# Patient Record
Sex: Female | Born: 1995 | Race: White | Hispanic: No | Marital: Married | Smoking: Never smoker
Health system: Southern US, Community
[De-identification: ages and names within clinical notes are randomized; demographics above are authoritative.]

## PROBLEM LIST (undated history)

## (undated) DIAGNOSIS — Z8759 Personal history of other complications of pregnancy, childbirth and the puerperium: Secondary | ICD-10-CM

## (undated) DIAGNOSIS — G43909 Migraine, unspecified, not intractable, without status migrainosus: Secondary | ICD-10-CM

## (undated) HISTORY — DX: Personal history of other complications of pregnancy, childbirth and the puerperium: Z87.59

## (undated) HISTORY — DX: Migraine, unspecified, not intractable, without status migrainosus: G43.909

## (undated) HISTORY — PX: WISDOM TOOTH EXTRACTION: SHX21

---

## 2019-06-22 ENCOUNTER — Ambulatory Visit: Payer: Self-pay | Admitting: Internal Medicine

## 2019-06-22 ENCOUNTER — Other Ambulatory Visit: Payer: Self-pay

## 2019-06-22 ENCOUNTER — Encounter: Payer: Self-pay | Admitting: Internal Medicine

## 2019-06-22 VITALS — BP 109/77 | HR 87 | Temp 99.7°F | Resp 14 | Ht 63.0 in | Wt 141.0 lb

## 2019-06-22 DIAGNOSIS — S93491A Sprain of other ligament of right ankle, initial encounter: Secondary | ICD-10-CM

## 2019-06-22 NOTE — Patient Instructions (Signed)
Ankle Sprain  An ankle sprain is a stretch or tear in one of the tough tissues (ligaments) that connect the bones in your ankle. An ankle sprain can happen when the ankle rolls outward (inversion sprain) or inward (eversion sprain). What are the causes? This condition is caused by rolling or twisting the ankle. What increases the risk? You are more likely to develop this condition if you play sports. What are the signs or symptoms? Symptoms of this condition include:  Pain in your ankle.  Swelling.  Bruising. This may happen right after you sprain your ankle or 1-2 days later.  Trouble standing or walking. How is this diagnosed? This condition is diagnosed with:  A physical exam. During the exam, your doctor will press on certain parts of your foot and ankle and try to move them in certain ways.  X-ray imaging. These may be taken to see how bad the sprain is and to check for broken bones. How is this treated? This condition may be treated with:  A brace or splint. This is used to keep the ankle from moving until it heals.  An elastic bandage. This is used to support the ankle.  Crutches.  Pain medicine.  Surgery. This may be needed if the sprain is very bad.  Physical therapy. This may help to improve movement in the ankle. Follow these instructions at home: If you have a brace or a splint:  Wear the brace or splint as told by your doctor. Remove it only as told by your doctor.  Loosen the brace or splint if your toes: ? Tingle. ? Lose feeling (become numb). ? Turn cold and blue.  Keep the brace or splint clean.  If the brace or splint is not waterproof: ? Do not let it get wet. ? Cover it with a watertight covering when you take a bath or a shower. If you have an elastic bandage (dressing):  Remove it to shower or bathe.  Try not to move your ankle much, but wiggle your toes from time to time. This helps to prevent swelling.  Adjust the dressing if it feels  too tight.  Loosen the dressing if your foot: ? Loses feeling. ? Tingles. ? Becomes cold and blue. Managing pain, stiffness, and swelling   Take over-the-counter and prescription medicines only as told by doctor.  For 2-3 days, keep your ankle raised (elevated) above the level of your heart.  If told, put ice on the injured area: ? If you have a removable brace or splint, remove it as told by your doctor. ? Put ice in a plastic bag. ? Place a towel between your skin and the bag. ? Leave the ice on for 20 minutes, 2-3 times a day. General instructions  Rest your ankle.  Do not use your injured leg to support your body weight until your doctor says that you can. Use crutches as told by your doctor.  Do not use any products that contain nicotine or tobacco, such as cigarettes, e-cigarettes, and chewing tobacco. If you need help quitting, ask your doctor.  Keep all follow-up visits as told by your doctor. Contact a doctor if:  Your bruises or swelling are quickly getting worse.  Your pain does not get better after you take medicine. Get help right away if:  You cannot feel your toes or foot.  Your foot or toes look blue.  You have very bad pain that gets worse. Summary  An ankle sprain is a stretch   or tear in one of the tough tissues (ligaments) that connect the bones in your ankle.  This condition is caused by rolling or twisting the ankle.  Symptoms include pain, swelling, bruising, and trouble walking.  To help with pain and swelling, put ice on the injured ankle, raise your ankle above the level of your heart, and use an elastic bandage. Also, rest as told by your doctor.  Keep all follow-up visits as told by your doctor. This is important. This information is not intended to replace advice given to you by your health care provider. Make sure you discuss any questions you have with your health care provider. Document Released: 05/11/2008 Document Revised: 04/19/2018  Document Reviewed: 04/19/2018 Elsevier Patient Education  2020 [employer] Inc[employer]  

## 2019-06-22 NOTE — Progress Notes (Signed)
S - presents with R ankle pain  Started this am, twisted coming down steps and painful driving, went to work and sent here as painful walking.   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 Butalbital-APAP-Caff-Cod 50-300-40-30 MG CAPS Take by mouth.    . levonorgestrel-ethinyl estradiol (ALESSE) 0.1-20 MG-MCG tablet Take by mouth.     No current facility-administered medications on file prior to visit.      No Known Allergies    No tob use  O - NAD, masked  BP 109/77 (BP Location: Left Arm, Patient Position: Sitting, Cuff Size: Normal)   Pulse 87   Temp 99.7 F (37.6 C) (Oral)   Resp 14   Ht 5\' 3"  (1.6 m)   Wt 141 lb (64 kg)   SpO2 97%   BMI 24.98 kg/m    R Ankle - Limited ROM at extremes due to pain, no increase in pain with resistance testing Tender greatest at ATF, NT inferior to lat malleolus, Able to bear weight only on her right foot in the office without pain, increased pain with trying to ambulate No swelling surrounding lateral malleolus presently No bruising  NT distal fibula NT sydesmotic region NT medial malleolus NT 5th MT Squeeze test neg Ant drawer test neg Increased pain with plantar flexion and inversion Neurovasc intact in foot, nails painted  Affect not flat, approp with conversation  Ass - Lat ankle sprain (inversion) - Mild/Grade I  Plan -  Educated on diagnosis X-ray discussed - Not felt indicated acutely with fracture unlikely  Relative rest  until symptoms improving Crutches not felt needed as did well with brace in place in office  RICE modalities reviewed with liberal use of ice at 20 minute intervals short term, elevate  Compression with tie -up brace and can use for support as improving and returning to more activities  Can use ibuprofen products, OTC strength 3-4 times daily with food, not use routinely, only prn Note given for work with limitations noted until Mon 7/20 and expect her to be better by then and not limited with normal activities and ok to return to work then without restrictions. If still havnig symptoms that are limiting her activities, instructed to F/u Monday.

## 2019-10-08 DIAGNOSIS — G43019 Migraine without aura, intractable, without status migrainosus: Secondary | ICD-10-CM | POA: Insufficient documentation

## 2019-10-17 ENCOUNTER — Other Ambulatory Visit: Payer: Self-pay

## 2019-10-17 ENCOUNTER — Ambulatory Visit: Payer: Self-pay

## 2019-10-17 DIAGNOSIS — R0989 Other specified symptoms and signs involving the circulatory and respiratory systems: Secondary | ICD-10-CM

## 2019-10-17 NOTE — Progress Notes (Signed)
Presents with C/O Nasal congestion & Runny nose.  Denies contact with anyone known to be or suspected to be positive for covid.  She was asking if she should have a covid test.  Spoke with Randel Pigg, PA-C (Interim Provider) in the clinic today.  Recommended she have a covid screen to R/O covid before allowing her into the clinic.  Explained to [facility] that the Provider recommended she have the covid screen to rule it out, before allowing her into the clinic.  Verbalized understanding.  States she has Mychart & if she goes to [facility], the results will be uploaded into it with 24-48 hours after the test.  Informed her they offer drive up testing.  If can't get it done today, said she will go in the morning.  Requested she update [place] on her results.  AMD

## 2019-10-18 ENCOUNTER — Other Ambulatory Visit: Payer: Self-pay

## 2019-10-18 DIAGNOSIS — Z20822 Contact with and (suspected) exposure to covid-19: Secondary | ICD-10-CM

## 2019-10-21 LAB — NOVEL CORONAVIRUS, NAA: SARS-CoV-2, NAA: DETECTED — AB

## 2020-06-11 ENCOUNTER — Ambulatory Visit (INDEPENDENT_AMBULATORY_CARE_PROVIDER_SITE_OTHER): Payer: BC Managed Care – PPO

## 2020-06-11 ENCOUNTER — Other Ambulatory Visit: Payer: Self-pay

## 2020-06-11 ENCOUNTER — Ambulatory Visit
Admission: EM | Admit: 2020-06-11 | Discharge: 2020-06-11 | Disposition: A | Discharge status: Home / Self Care | Payer: BC Managed Care – PPO | Attending: Emergency Medicine | Admitting: Emergency Medicine

## 2020-06-11 ENCOUNTER — Encounter: Payer: Self-pay | Admitting: Emergency Medicine

## 2020-06-11 DIAGNOSIS — S838X2A Sprain of other specified parts of left knee, initial encounter: Secondary | ICD-10-CM

## 2020-06-11 DIAGNOSIS — S86812A Strain of other muscle(s) and tendon(s) at lower leg level, left leg, initial encounter: Secondary | ICD-10-CM | POA: Diagnosis not present

## 2020-06-11 MED ORDER — IBUPROFEN 600 MG PO TABS
600.0000 mg | ORAL_TABLET | Freq: Four times a day (QID) | ORAL | 0 refills | Status: DC | PRN
Start: 2020-06-11 — End: 2020-12-26

## 2020-06-11 NOTE — ED Triage Notes (Signed)
Patient in today c/o left lower leg pain x today. Patient states she was at the gym and was doing dead lifts with a heavy weight and heard something pop in her upper shin.

## 2020-06-11 NOTE — ED Provider Notes (Signed)
HPI  SUBJECTIVE:  Kathryn Wong is a 24 y.o. female who presents with left knee pain inferior to her patella at the tibial tubercle occurring immediately prior to arrival.  States that she was doing dead lifts with heavy weights, felt a pop in the area of pain.  It is sharp, intermittent, present with certain movements.  No swelling, erythema, distal calf swelling.  No knee pain.  No direct trauma to the area of pain.  She states that her knee feels stable.  No recent fluoroquinolone use.  She has not tried anything for this.  Symptoms are better with sitting still, worse with knee flexion and weightbearing.  Past medical history negative for tendon rupture, diabetes, hypertension.  LMP: 2 weeks ago.  Denies possibility being pregnant.  PMD: [facility].  Past Medical History:  Diagnosis Date  . Migraine     Past Surgical History:  Procedure Laterality Date  . WISDOM TOOTH EXTRACTION      Family History  Problem Relation Age of Onset  . Healthy Mother   . Healthy Father     Social History   Tobacco Use  . Smoking status: Never Smoker  . Smokeless tobacco: Never Used  Vaping Use  . Vaping Use: Never used  Substance Use Topics  . Alcohol use: Yes    Comment: socially  . Drug use: Never    No current facility-administered medications for this encounter.  Current Outpatient Medications:  .  AJOVY 225 MG/1.5ML SOAJ, Inject 1 Syringe into the skin every 28 (twenty-eight) days., Disp: , Rfl:  .  Butalbital-APAP-Caff-Cod 50-300-40-30 MG CAPS, Take by mouth., Disp: , Rfl:  .  levonorgestrel-ethinyl estradiol (ALESSE) 0.1-20 MG-MCG tablet, Take by mouth., Disp: , Rfl:  .  rizatriptan (MAXALT) 10 MG tablet, Take by mouth., Disp: , Rfl:  .  ibuprofen (ADVIL) 600 MG tablet, Take 1 tablet (600 mg total) by mouth every 6 (six) hours as needed., Disp: 30 tablet, Rfl: 0  No Known Allergies   ROS  As noted in HPI.   Physical Exam  BP (!) 123/91 (BP Location: Left  Arm)   Pulse 80   Temp 98.1 F (36.7 C) (Oral)   Resp 18   Ht 5\' 3"  (1.6 m)   Wt 66.2 kg   LMP 05/28/2020 (Approximate)   SpO2 100%   BMI 25.86 kg/m   Constitutional: Well developed, well nourished, no acute distress Eyes:  EOMI, conjunctiva normal bilaterally HENT: Normocephalic, atraumatic,mucus membranes moist Respiratory: Normal inspiratory effort Cardiovascular: Normal rate GI: nondistended skin: No rash, skin intact Musculoskeletal: Patella nontender.  Patellar tendon nontender.  Positive tenderness at the tibial tubercle.  No soft tissue defect.  Mild knee crepitus with flexion/extension, patient states that this is not new.  No erythema, bruising. left Kneecap symmetric with the right leg.  No tenderness superior to the patella.  No tenderness over the entire knee.  Patient able to flex/extend at knee.  No tenderness along the distal tibia, fibula, ankle.  DP 2+.  Sensation grossly intact distally. Neurologic: Alert & oriented x 3, no focal neuro deficits Psychiatric: Speech and behavior appropriate   ED Course   Medications - No data to display  Orders Placed This Encounter  Procedures  . DG Tibia/Fibula Left    Standing Status:   Standing    Number of Occurrences:   1    Order Specific Question:   Reason for Exam (SYMPTOM  OR DIAGNOSIS REQUIRED)    Answer:   tenderness  at tibial tubercle.  Rule out fracture.  Marland Kitchen Apply ace wrap    Left leg under knee    Standing Status:   Standing    Number of Occurrences:   1  . Crutches    Standing Status:   Standing    Number of Occurrences:   1    No results found for this or any previous visit (from the past 24 hour(s)). DG Tibia/Fibula Left  Result Date: 06/11/2020 CLINICAL DATA:  Tenderness at tibial tubercle. EXAM: LEFT TIBIA AND FIBULA - 2 VIEW COMPARISON:  No prior. FINDINGS: No acute bony or joint abnormality identified. No evidence of fracture or dislocation. Soft tissues are unremarkable. IMPRESSION: No acute  abnormality. Electronically Signed   By: Maisie Fus  Register   On: 06/11/2020 12:18    ED Clinical Impression  1. Patellar tendon strain, left, initial encounter      ED Assessment/Plan  Suspect avulsion fracture of the tibial tubercle.  Getting tib-fib, will reassess.  Plan to put in Ace wrap, crutches, Tylenol/ibuprofen, rest, ice..   [place] Narcotic database reviewed for this patient, and feel that the risk/benefit ratio today is favorable for proceeding with a prescription for controlled substance.  Reviewed imaging independently.  No fracture or dislocation.  See radiology report for full details.  Presentation consistent with a left patellar tendon strain/sprain.  Follow-up with [facility] orth[facility]ics in a week if not better.  Discussed  imaging, MDM, treatment plan, and plan for follow-up with patient. patient agrees with plan.   Meds ordered this encounter  Medications  . ibuprofen (ADVIL) 600 MG tablet    Sig: Take 1 tablet (600 mg total) by mouth every 6 (six) hours as needed.    Dispense:  30 tablet    Refill:  0    *This clinic note was created using Scientist, clinical (histocompatibility and immunogenetics). Therefore, there may be occasional mistakes despite careful proofreading.   ?    Domenick Gong, MD 06/11/20 1725

## 2020-06-11 NOTE — Discharge Instructions (Addendum)
Your x-ray was negative for fracture.  I suspect that you have partially torn your patellar tendon or at least strained it.  Ace wrap, crutches, ice your knee for 15 minutes at a time.  Take ibuprofen 600 mg combined with the 1000 mg of Tylenol 3-4 times a day as needed for pain.  Follow-up with [facility] if not better in a week.  You may need an MRI to fully assess the extent of the injury.

## 2020-12-22 ENCOUNTER — Emergency Department
Admission: EM | Admit: 2020-12-22 | Discharge: 2020-12-23 | Disposition: A | Discharge status: Home / Self Care | Payer: BC Managed Care – PPO | Attending: Emergency Medicine | Admitting: Emergency Medicine

## 2020-12-22 ENCOUNTER — Other Ambulatory Visit: Payer: Self-pay

## 2020-12-22 ENCOUNTER — Emergency Department: Payer: BC Managed Care – PPO

## 2020-12-22 DIAGNOSIS — Z3A01 Less than 8 weeks gestation of pregnancy: Secondary | ICD-10-CM | POA: Insufficient documentation

## 2020-12-22 DIAGNOSIS — O00102 Left tubal pregnancy without intrauterine pregnancy: Secondary | ICD-10-CM | POA: Diagnosis not present

## 2020-12-22 DIAGNOSIS — O4690 Antepartum hemorrhage, unspecified, unspecified trimester: Secondary | ICD-10-CM

## 2020-12-22 DIAGNOSIS — O26851 Spotting complicating pregnancy, first trimester: Secondary | ICD-10-CM | POA: Diagnosis present

## 2020-12-22 DIAGNOSIS — O009 Unspecified ectopic pregnancy without intrauterine pregnancy: Secondary | ICD-10-CM

## 2020-12-22 LAB — ABO/RH: ABO/RH(D): O POS

## 2020-12-22 LAB — HEPATIC FUNCTION PANEL
ALT: 30 U/L (ref 0–44)
AST: 29 U/L (ref 15–41)
Albumin: 4.4 g/dL (ref 3.5–5.0)
Alkaline Phosphatase: 63 U/L (ref 38–126)
Bilirubin, Direct: 0.1 mg/dL (ref 0.0–0.2)
Indirect Bilirubin: 1 mg/dL — ABNORMAL HIGH (ref 0.3–0.9)
Total Bilirubin: 1.1 mg/dL (ref 0.3–1.2)
Total Protein: 7.3 g/dL (ref 6.5–8.1)

## 2020-12-22 LAB — BASIC METABOLIC PANEL
Anion gap: 12 (ref 5–15)
BUN: 13 mg/dL (ref 6–20)
CO2: 25 mmol/L (ref 22–32)
Calcium: 9.1 mg/dL (ref 8.9–10.3)
Chloride: 102 mmol/L (ref 98–111)
Creatinine, Ser: 0.72 mg/dL (ref 0.44–1.00)
GFR, Estimated: >60 mL/min (ref >60)
Glucose, Bld: 96 mg/dL (ref 70–99)
Potassium: 3.9 mmol/L (ref 3.5–5.1)
Sodium: 139 mmol/L (ref 135–145)

## 2020-12-22 LAB — CBC
HCT: 39.2 % (ref 36.0–46.0)
Hemoglobin: 13.1 g/dL (ref 12.0–15.0)
MCH: 29.9 pg (ref 26.0–34.0)
MCHC: 33.4 g/dL (ref 30.0–36.0)
MCV: 89.5 fL (ref 80.0–100.0)
Platelets: 312 10*3/uL (ref 150–400)
RBC: 4.38 MIL/uL (ref 3.87–5.11)
RDW: 12.1 % (ref 11.5–15.5)
WBC: 10.3 10*3/uL (ref 4.0–10.5)
nRBC: 0 % (ref 0.0–0.2)

## 2020-12-22 LAB — HCG, QUANTITATIVE, PREGNANCY: hCG, Beta Chain, Quant, S: 3088 m[IU]/mL — ABNORMAL HIGH (ref <5)

## 2020-12-22 LAB — POC URINE PREG, ED: Preg Test, Ur: POSITIVE — AB

## 2020-12-22 MED ORDER — METHOTREXATE FOR ECTOPIC PREGNANCY
50.0000 mg/m2 | Freq: Once | INTRAMUSCULAR | Status: AC
  Administered 2020-12-22: 90 mg via INTRAMUSCULAR
  Filled 2020-12-22: qty 1

## 2020-12-22 NOTE — ED Triage Notes (Signed)
Reports pelvic cramping over last few days, light red spotting this AM and then heavier bleeding that started at 3 PM today. Pt approx [redacted] weeks pregnant. Has not bled through 1 pad yet.

## 2020-12-22 NOTE — Consult Note (Signed)
Consult History and Physical   SERVICE: Obstetrics/Gynecology  Patient Name: Kathryn Wong Patient MRN:   [id number]  CC: left side pelvic pain and light red vaginal bleeding.   HPI: Kathryn Wong is a 25 y.o. G1P0 with left side pelvic pain and vaginal bleeding in early pregnancy.     Review of Systems: positives in bold GEN:   fevers, chills, weight changes, appetite changes, fatigue, night sweats HEENT:  HA, vision changes, hearing loss, congestion, rhinorrhea, sinus pressure, dysphagia CV:   CP, palpitations PULM:  SOB, cough GI:  abd pain, N/V/D/C GU:  dysuria, urgency, frequency, left pelvic pain and light red vaginal bleeding.  MSK:  arthralgias, myalgias, back pain, swelling SKIN:  rashes, color changes, pallor NEURO:  numbness, weakness, tingling, seizures, dizziness, tremors PSYCH:  depression, anxiety, behavioral problems, confusion  HEME/LYMPH:  easy bruising or bleeding ENDO:  heat/cold intolerance  Past Obstetrical History: OB History    Gravida  1   Para      Term      Preterm      AB      Living        SAB      IAB      Ectopic      Multiple      Live Births              Past Gynecologic History: Patient's last menstrual period was 10/22/2020 (approximate). Menstrual frequency Q4wks lasting 5days for light flow, requiring 2-3 pads/day  Past Medical History: Past Medical History:  Diagnosis Date  . Migraine     Past Surgical History:   Past Surgical History:  Procedure Laterality Date  . WISDOM TOOTH EXTRACTION      Family History:  family history includes Healthy in her father and mother.  Social History:  Social History   Socioeconomic History  . Marital status: Single    Spouse name: Not on file  . Number of children: Not on file  . Years of education: Not on file  . Highest education level: Not on file  Occupational History  . Not on file  Tobacco Use  . Smoking status: Never Smoker  . Smokeless tobacco:  Never Used  Vaping Use  . Vaping Use: Never used  Substance and Sexual Activity  . Alcohol use: Yes    Comment: socially  . Drug use: Never  . Sexual activity: Not on file  Other Topics Concern  . Not on file  Social History Narrative  . Not on file   Social Determinants of Health   Financial Resource Strain: Not on file  Food Insecurity: Not on file  Transportation Needs: Not on file  Physical Activity: Not on file  Stress: Not on file  Social Connections: Not on file  Intimate Partner Violence: Not on file    Home Medications:  Medications reconciled in EPIC  No current facility-administered medications on file prior to encounter.   Current Outpatient Medications on File Prior to Encounter  Medication Sig Dispense Refill  . AJOVY 225 MG/1.5ML SOAJ Inject 1 Syringe into the skin every 28 (twenty-eight) days.    . Butalbital-APAP-Caff-Cod 50-300-40-30 MG CAPS Take by mouth.    Marland Kitchen ibuprofen (ADVIL) 600 MG tablet Take 1 tablet (600 mg total) by mouth every 6 (six) hours as needed. 30 tablet 0  . levonorgestrel-ethinyl estradiol (ALESSE) 0.1-20 MG-MCG tablet Take by mouth.    . rizatriptan (MAXALT) 10 MG tablet Take by mouth.      Allergies:  No Known Allergies  Physical Exam:  Temp:  [98 F (36.7 C)] 98 F (36.7 C) (01/16 1958) Pulse Rate:  [87] 87 (01/16 1958) Resp:  [16] 16 (01/16 1958) BP: (154)/(81) 154/81 (01/16 1958) SpO2:  [98 %] 98 % (01/16 1958) Weight:  [69.9 kg-70.2 kg] 70.2 kg (01/16 2100)   General Appearance:  Well developed, well nourished, no acute distress, alert and oriented x3 HEENT:  Normocephalic atraumatic, extraocular movements intact, moist mucous membranes Cardiovascular:  regular rate and rhythm, no murmurs Pulmonary:  Normal respiratory effort Abdomen:  soft, nontender, nondistended, no abnormal masses, no epigastric pain Extremities:  Full range of motion, no pedal edema Skin:  normal coloration and turgor, no rashes Psychiatric:   Normal mood and affect, appropriately upset and concerned, no AH/VH Pelvic:  deferred   Labs/Studies:   CBC and Coags:  Lab Results  Component Value Date   WBC 10.3 12/22/2020   HGB 13.1 12/22/2020   HCT 39.2 12/22/2020   MCV 89.5 12/22/2020   PLT 312 12/22/2020   CMP:  Lab Results  Component Value Date   NA 139 12/22/2020   K 3.9 12/22/2020   CL 102 12/22/2020   CO2 25 12/22/2020   BUN 13 12/22/2020   CREATININE 0.72 12/22/2020   Other Labs: Pending CMP- LFTs needed  TVUS:   Other Imaging: US OB LESS THAN 14 WEEKS WITH OB TRANSVAGINAL  Result Date: 12/22/2020 CLINICAL DATA:  Vaginal bleeding EXAM: OBSTETRIC <14 WK [place] AND TRANSVAGINAL OB US TECHNIQUE: Both transabdominal and transvaginal ultrasound examinations were performed for complete evaluation of the gestation as well as the maternal uterus, adnexal regions, and pelvic cul-de-sac. Transvaginal technique was performed to assess early pregnancy. COMPARISON:  None. FINDINGS: Intrauterine gestational sac: None Yolk sac:  Not Visualized. Embryo:  Visualized within the left adnexa. Cardiac Activity: Not Visualized. Heart Rate: N/A  bpm CRL:  6.4 mm   6 w   3 d                  [place] EDC: N/A Subchorionic hemorrhage:  Moderate Maternal uterus/adnexae: The right ovary measures 2.9 cm x 1.6 cm x 2.3 cm and is normal in appearance. The left ovary measures 2.7 cm x 2.0 cm x 2.5 cm and is normal in appearance. A 1.5 cm x 1.7 cm left adnexal gestational sac is seen, adjacent to the left ovary. This contains a fetal pole which measures 6.4 mm. No fetal cardiac activity is identified. IMPRESSION: 1. Findings consistent with a nonviable left adnexal ectopic pregnancy, at approximately 6 weeks and 3 days gestation by ultrasound evaluation. Electronically Signed   By: Aram Candela M.D.   On: 12/22/2020 21:12     Assessment / Plan:   Kathryn Wong is a 25 y.o. G1P0 who presents with left pelvic pain and vaginal bleeding.    Reviewed  the results from TVUS and lab studies with Kathryn Wong and answered all of her questions. Her HCG is 3088, Hgb 13.1, and TVUS findings c/w left adnexal gestational sac with fetal pole c/w ectopic pregnancy.  Discussed lab results and imaging with Dr Dalbert Garnet, attending Ob physician, Kathryn Wong is a candidate for MTX therapy.   Kathryn Wong's overall appearance and physical exam are benign and present no need for acute mgmt at this time. Based on her lab values and US findings we are unable to determine the exact location of her pregnancy. I counseled the Kathryn Wong about how an ectopic pregnancy can result in life-threatening complications. Reviewed  with the Kathryn Wong about the importance of serial HCG examinations, the first on Thursday (day 4 after MTX injection)   2. Rh status: Kathryn Wong's MBT is O Pos. She is not a candidate for 50 mcg of Rho(D) IgG.    Thank you for the opportunity to be involved with this Kathryn Wong's care.   Randa Ngo, CNM 12/22/2020 10:15 PM

## 2020-12-22 NOTE — Discharge Instructions (Signed)
Ectopic Pregnancy  An ectopic pregnancy happens when a fertilized egg attaches (implants) outside the uterus. In a normal pregnancy, a fertilized egg implants in the uterus. An ectopic pregnancy cannot develop into a healthy baby. Most ectopic pregnancies occur in one of the fallopian tubes, which is where an egg travels from an ovary to get to the uterus. This is called a tubal pregnancy. An ectopic pregnancy can also happen on an ovary, on the cervix, or in the abdomen. When a fertilized egg implants on tissue outside the uterus and begins to grow, it may cause the tissue to tear or burst. This is known as a ruptured ectopic pregnancy. The tear or burst causes internal bleeding. This may cause intense pain in the abdomen. An ectopic pregnancy is a medical emergency and can be life-threatening. What are the causes? The most common cause of this condition is damage to one of the fallopian tubes. A fallopian tube may be narrowed or blocked, and that stops the fertilized egg from reaching the uterus. Sometimes, the cause of this condition is not known. What increases the risk? The following factors may make you more likely to develop this condition:  Having gone through infertility treatment before.  Having had an ectopic pregnancy before.  Having had surgery to have the fallopian tubes tied.  Becoming pregnant while using an intrauterine device for birth control.  Taking birth control pills before the age of 16. Other risk factors include:  Smoking.  Alcohol use.  History of DES exposure. DES is a medicine that was used until 1971 and affected babies whose mothers took the medicine. What are the signs or symptoms? Common symptoms of this condition include:  Missing a menstrual period.  Nausea or tiredness.  Tender breasts.  Other normal pregnancy symptoms. Other symptoms may include:  Pain during sex.  Vaginal bleeding or spotting.  Cramping or pain in the lower abdomen.  A  fast heartbeat, low blood pressure, and sweating.  Pain or increased pressure while having a bowel movement. Symptoms of a ruptured ectopic pregnancy and internal bleeding may include:  Sudden, severe pain in the abdomen.  Dizziness, weakness, feeling light-headed, or fainting.  Pain in the shoulder or neck area. How is this diagnosed? This condition is diagnosed by:  A blood test to check for the pregnancy hormone.  A pelvic exam to find painful areas or a mass in the abdomen.  Ultrasound. A probe is inserted into the vagina to see if there is a pregnancy in or outside the uterus.  Taking a sample of tissue from the uterus.  Surgery to look closely at the fallopian tubes through an incision in the abdomen. How is this treated? This condition is usually treated with medicine or surgery. Sometimes, ectopic pregnancies can resolve on their own, under close monitoring by your health care provider. Medicine A medicine called methotrexate may be given to cause the pregnancy tissue to be absorbed. The medicine may be given if:  The diagnosis is made early, with no signs of active bleeding.  The fallopian tube has not torn or burst. You will need blood tests to make sure the medicine is working. It may take 4-6 weeks for the pregnancy tissues to be absorbed. Surgery Surgery may be performed to:  Remove the pregnancy tissue.  Stop internal bleeding.  Remove part or all of the fallopian tube.  Remove the uterus. This is rare. After surgery, you may need to have blood tests to make sure the surgery worked.   Follow these instructions at home: Medicines  Take over-the-counter and prescription medicines only as told by your health care provider.  Ask your health care provider if the medicine prescribed to you: ? Requires you to avoid driving or using machinery. ? Can cause constipation. You may need to take these actions to prevent or treat constipation:  Drink enough fluid to  keep your urine pale yellow.  Take over-the-counter or prescription medicines.  Eat foods that are high in fiber, such as beans, whole grains, and fresh fruits and vegetables.  Limit foods that are high in fat and processed sugars, such as fried or sweet foods. General instructions  Rest or limit your activity, if told by your health care provider.  Do not have sex or put anything in your vagina, such as tampons or douches, for 6 weeks or until your health care provider says it is safe.  Do not lift anything that is heavier than 10 lb (4.5 kg), or the limit that you are told, until your health care provider says that it is safe.  Return to your normal activities as told by your health care provider. Ask your health care provider what activities are safe for you.  Keep all follow-up visits. This is important. Contact a health care provider if:  You have a fever or chills.  You have nausea and vomiting. Get help right away if:  Your pain gets worse or is not relieved by medicine.  You feel dizzy or weak.  You feel light-headed or you faint.  You have sudden, severe pain in your abdomen.  You have sudden pain in the shoulder or neck area. Summary  An ectopic pregnancy happens when a fertilized egg implants outside the uterus. Most ectopic pregnancies occur in one of the fallopian tubes.  An ectopic pregnancy is a medical emergency and can be life-threatening.  The most common cause of this condition is damage to one of the fallopian tubes.  This condition is usually treated with medicine or surgery. Some ectopic pregnancies resolve on their own, under close monitoring by your health care provider. This information is not intended to replace advice given to you by your health care provider. Make sure you discuss any questions you have with your health care provider. Document Revised: 03/05/2020 Document Reviewed: 03/05/2020 Elsevier Patient Education  2021 Elsevier  Inc.    Methotrexate Treatment for an Ectopic Pregnancy Methotrexate is a medicine that treats an ectopic pregnancy. In this type of pregnancy, the fertilized egg attaches (implants) outside the uterus. An ectopic pregnancy cannot develop into a healthy baby. Methotrexate works by stopping the growth of the fertilized egg. It also helps the body absorb tissue from the egg. This takes about 2-6 weeks. An ectopic pregnancy can be life-threatening. However, most ectopic pregnancies can be successfully treated with methotrexate if they are diagnosed early. Tell a health care provider about:  Any allergies you have.  All medicines you are taking, including vitamins, herbs, eye drops, creams, and over-the-counter medicines.  Any medical conditions you have. What are the risks? Generally, this is a safe treatment. However, problems may occur, including:  Digestive problems. You may have: ? Nausea. ? Vomiting. ? Diarrhea. ? Cramping in your abdomen.  Bleeding or spotting from your vagina.  Feeling dizzy or light-headed.  Mouth sores.  Inflammation of the lining of your lungs (pneumonitis).  Damage to nearby structures or organs, such as damage to the liver.  Hair loss. There is a risk that methotrexate treatment   will fail and the pregnancy will continue. There is also a risk that the ectopic pregnancy might tear or burst (rupture) during use of this medicine. What happens before the procedure?  Blood tests will be done to check how your disease-fighting system (immune system), liver, and kidneys are working.  You will also have blood tests to measure your pregnancy hormone levels and to find out your blood type.  You will be given a shot of a medicine called Rho(D) immune globulin if: ? You are Rh-negative and the father is Rh-positive. ? You are Rh-negative and the father's Rh type is unknown. What happens during the procedure?  Methotrexate will be injected into your  muscle. ? Methotrexate may be given as a single dose of medicine or a series of doses over time, depending on your response to the treatment. ? Methotrexate injections are given by a health care provider. Injection is the most common way that this medicine is used to treat an ectopic pregnancy.  You may also receive other medicines to manage your ectopic pregnancy. The procedure may vary among health care providers and hospitals. What can I expect after treatment? After your treatment, it is common to have:  Cramping in your abdomen.  Bleeding in your vagina.  Tiredness (fatigue).  Nausea.  Vomiting.  Diarrhea. Blood tests will be done at timed intervals for several days or weeks to check your pregnancy hormone levels. The blood tests will be done until the pregnancy hormone can no longer be found in the blood. If the methotrexate treatment does not work, a surgical procedure may be done to remove the ectopic pregnancy. Follow these instructions at home: Medicines  Take over-the-counter and prescription medicines only as told by your health care provider.  Do not take prescription pain medicines, aspirin, ibuprofen, naproxen, or any other NSAIDs.  Do not take folic acid, prenatal vitamins, or other vitamins that contain folic acid. Activity  Do not have sex, douche, or put anything, such as tampons, in your vagina until your health care provider says it is okay.  Limit activities that take a lot of effort as told by your health care provider. General instructions  Do not drink alcohol.  Follow instructions from your health care provider about eating restrictions, such as avoiding foods that produce a lot of gas. These foods can hide the signs of a ruptured ectopic pregnancy.  Limit exposure to sunlight or artificial UV light such as from tanning beds. Methotrexate can make you more sensitive to the sun.  Follow instructions from your health care provider on how and when to  report any symptoms that may indicate a ruptured ectopic pregnancy.  Keep all follow-up visits. This is important.   Contact a health care provider if:  You have persistent nausea and vomiting.  You have persistent diarrhea.  You are having a reaction to the medicine. This may include: ? Unusual fatigue. ? Skin rash. Get help right away if:  Pain in your abdomen or in the area between your hip bones (pelvic area) gets worse.  You have more bleeding from your vagina.  You feel light-headed or you faint.  You are short of breath.  Your heart rate increases.  You develop a cough.  You have chills or a fever. Summary  Methotrexate is a medicine that treats an ectopic pregnancy. This type of pregnancy forms outside the uterus.  There is a risk that methotrexate treatment will fail and the pregnancy will continue. There is also a   risk that the ectopic pregnancy might tear or burst during use of this medicine.  This medicine may be given in a single dose or a series of doses over time.  After your treatment, blood tests will be done at timed intervals for several days or weeks to check your pregnancy hormone levels. The blood tests will be done until no more pregnancy hormone is found in the blood. This information is not intended to replace advice given to you by your health care provider. Make sure you discuss any questions you have with your health care provider. Document Revised: 05/08/2020 Document Reviewed: 05/08/2020 Elsevier Patient Education  2021 Elsevier Inc.   

## 2020-12-22 NOTE — ED Provider Notes (Signed)
[facility] Emergency Department Provider Note   ____________________________________________   Event Date/Time   First MD Initiated Contact with Patient 12/22/20 2033     (approximate)  I have reviewed the triage vital signs and the nursing notes.   HISTORY  Chief Complaint Vaginal Bleeding    HPI Dinesha Twiggs is a 25 y.o. female G1, P0  Patient reports that she had her first positive pregnancy test on New Year's.  She has never been pregnant before and the last couple of days she has noticed some crampy pain in her left lower abdomen, she also had some light spotting and then heavier red vaginal bleeding this evening.  She called and spoke with midwife at [facility] clinic who recommended she come to the ER for evaluation  She has blood through about 1 pad  Reports mild discomfort in the left lower abdomen.  No fevers or chills.  No chest pain or trouble breathing.  No nausea had some vomiting earlier in the pregnancy but none recent   Past Medical History:  Diagnosis Date  . Migraine     Patient Active Problem List   Diagnosis Date Noted  . Intractable migraine without aura and without status migrainosus 10/08/2019    Past Surgical History:  Procedure Laterality Date  . WISDOM TOOTH EXTRACTION      Prior to Admission medications   Medication Sig Start Date End Date Taking? Authorizing Provider  AJOVY 225 MG/1.5ML SOAJ Inject 1 Syringe into the skin every 28 (twenty-eight) days. 05/18/20   [provider]  Butalbital-APAP-Caff-Cod 50-300-40-30 MG CAPS Take by mouth.    [provider]  ibuprofen (ADVIL) 600 MG tablet Take 1 tablet (600 mg total) by mouth every 6 (six) hours as needed. 06/11/20   Domenick Gong, MD  levonorgestrel-ethinyl estradiol (ALESSE) 0.1-20 MG-MCG tablet Take by mouth. 05/10/19   [provider]  rizatriptan (MAXALT) 10 MG tablet Take by mouth. 12/23/19   [provider]     Allergies Patient has no known allergies.  Family History  Problem Relation Age of Onset  . Healthy Mother   . Healthy Father     Social History Social History   Tobacco Use  . Smoking status: Never Smoker  . Smokeless tobacco: Never Used  Vaping Use  . Vaping Use: Never used  Substance Use Topics  . Alcohol use: Yes    Comment: socially  . Drug use: Never    Review of Systems Constitutional: No fever/chills ENT: No sore throat. Cardiovascular: Denies chest pain. Respiratory: Denies shortness of breath. Gastrointestinal: See HPI Genitourinary: See HPI Musculoskeletal: Negative for back pain. Skin: Negative for rash. Neurological: Negative for headaches, areas of focal weakness or numbness.    ____________________________________________   PHYSICAL EXAM:  VITAL SIGNS: ED Triage Vitals  Enc Vitals Group     BP 12/22/20 1958 (!) 154/81     Pulse Rate 12/22/20 1958 87     Resp 12/22/20 1958 16     Temp 12/22/20 1958 98 F (36.7 C)     Temp Source 12/22/20 1958 Oral     SpO2 12/22/20 1958 98 %     Weight 12/22/20 1959 154 lb (69.9 kg)     Height 12/22/20 1959 5\' 3"  (1.6 m)     Head Circumference --      Peak Flow --      Pain Score 12/22/20 1959 5     Pain Loc --      Pain Edu? --  Excl. in GC? --     Constitutional: Alert and oriented. Well appearing and in no acute distress.  Very pleasant. Eyes: Conjunctivae are normal. Head: Atraumatic. Nose: No congestion/rhinnorhea. Mouth/Throat: Mucous membranes are moist. Neck: No stridor.  Cardiovascular: Normal rate, regular rhythm. Grossly normal heart sounds.  Good peripheral circulation. Respiratory: Normal respiratory effort.  No retractions. Lungs CTAB. Gastrointestinal: Soft and nontender except reports mild tenderness in the left lower quadrant without rebound or guarding. No distention. Musculoskeletal: No lower extremity tenderness nor edema. Neurologic:  Normal speech and language. No  gross focal neurologic deficits are appreciated.  Skin:  Skin is warm, dry and intact. No rash noted. Psychiatric: Mood and affect are normal. Speech and behavior are normal.  ____________________________________________   LABS (all labs ordered are listed, but only abnormal results are displayed)  Labs Reviewed  HCG, QUANTITATIVE, PREGNANCY - Abnormal; Notable for the following components:      Result Value   hCG, Beta Chain, Quant, S 3,088 (*)    All other components within normal limits  HEPATIC FUNCTION PANEL - Abnormal; Notable for the following components:   Indirect Bilirubin 1.0 (*)    All other components within normal limits  POC URINE PREG, ED - Abnormal; Notable for the following components:   Preg Test, Ur POSITIVE (*)    All other components within normal limits  CBC  BASIC METABOLIC PANEL  ABO/RH   ____________________________________________  EKG   ____________________________________________  RADIOLOGY  US OB LESS THAN 14 WEEKS WITH OB TRANSVAGINAL  Result Date: 12/22/2020 CLINICAL DATA:  Vaginal bleeding EXAM: OBSTETRIC <14 WK [place] AND TRANSVAGINAL OB US TECHNIQUE: Both transabdominal and transvaginal ultrasound examinations were performed for complete evaluation of the gestation as well as the maternal uterus, adnexal regions, and pelvic cul-de-sac. Transvaginal technique was performed to assess early pregnancy. COMPARISON:  None. FINDINGS: Intrauterine gestational sac: None Yolk sac:  Not Visualized. Embryo:  Visualized within the left adnexa. Cardiac Activity: Not Visualized. Heart Rate: N/A  bpm CRL:  6.4 mm   6 w   3 d                  [place] EDC: N/A Subchorionic hemorrhage:  Moderate Maternal uterus/adnexae: The right ovary measures 2.9 cm x 1.6 cm x 2.3 cm and is normal in appearance. The left ovary measures 2.7 cm x 2.0 cm x 2.5 cm and is normal in appearance. A 1.5 cm x 1.7 cm left adnexal gestational sac is seen, adjacent to the left ovary. This contains a  fetal pole which measures 6.4 mm. No fetal cardiac activity is identified. IMPRESSION: 1. Findings consistent with a nonviable left adnexal ectopic pregnancy, at approximately 6 weeks and 3 days gestation by ultrasound evaluation. Electronically Signed   By: Aram Candela M.D.   On: 12/22/2020 21:12    Discussed with Dr. Londell Moh personally   Findings consistent with a nonviable left adnexal ectopic pregnancy   ____________________________________________   PROCEDURES  Procedure(s) performed: None  Procedures  Critical Care performed: No  ____________________________________________   INITIAL IMPRESSION / ASSESSMENT AND PLAN / ED COURSE  Pertinent labs & imaging results that were available during my care of the patient were reviewed by me and considered in my medical decision making (see chart for details).   Patient is for evaluation of crampy discomfort left lower quadrant.  Patient recently pregnant.  Differential diagnosis includes gynecologic etiology such as ectopic, threatened miscarriage, first trimester bleeding, subchorionic hemorrhage etc.  She does not  have systemic symptoms or fever.  Reassuring clinical and abdominal examination without peritonitis.  Hemodynamics are normal except for some slight hypertension and she is fully awake and alert nontoxic in appearance.  Able to ambulate without difficulty describes some bleeding but not excessive.  Consult placed with OB/GYN for ectopic.  Seen and evaluated by Gevena Barre  Case discussed with Dr. Dalbert Garnet, they are going to start her on methotrexate and anticipate discharge thereafter with a follow-up setup in 4 days as well.  ----------------------------------------- 10:54 PM on 12/22/2020 -----------------------------------------  Patient to receive methotrexate as ordered by OB/GYN, and then after brief observation plan is to discharge with careful return precautions and follow-up as scheduled in 4 days with  OB/GYN.  Return precautions and treatment recommendations and follow-up discussed with the patient who is agreeable with the plan.       ____________________________________________   FINAL CLINICAL IMPRESSION(S) / ED DIAGNOSES  Final diagnoses:  Ectopic pregnancy, unspecified location, unspecified whether intrauterine pregnancy present        Note:  This document was prepared using Dragon voice recognition software and may include unintentional dictation errors       Sharyn Creamer, MD 12/22/20 2333

## 2020-12-22 NOTE — ED Notes (Signed)
OB RN at bedside to administer methotexate.

## 2020-12-23 ENCOUNTER — Other Ambulatory Visit: Payer: Self-pay

## 2020-12-23 ENCOUNTER — Emergency Department
Admission: EM | Admit: 2020-12-23 | Discharge: 2020-12-23 | Disposition: A | Discharge status: Home / Self Care | Payer: BC Managed Care – PPO | Source: Home / Self Care | Attending: Emergency Medicine | Admitting: Emergency Medicine

## 2020-12-23 ENCOUNTER — Emergency Department: Payer: BC Managed Care – PPO

## 2020-12-23 DIAGNOSIS — Z3A01 Less than 8 weeks gestation of pregnancy: Secondary | ICD-10-CM | POA: Insufficient documentation

## 2020-12-23 DIAGNOSIS — O00102 Left tubal pregnancy without intrauterine pregnancy: Secondary | ICD-10-CM

## 2020-12-23 LAB — CBC
HCT: 38.9 % (ref 36.0–46.0)
Hemoglobin: 13.3 g/dL (ref 12.0–15.0)
MCH: 30.4 pg (ref 26.0–34.0)
MCHC: 34.2 g/dL (ref 30.0–36.0)
MCV: 88.8 fL (ref 80.0–100.0)
Platelets: 317 10*3/uL (ref 150–400)
RBC: 4.38 MIL/uL (ref 3.87–5.11)
RDW: 12.4 % (ref 11.5–15.5)
WBC: 8.9 10*3/uL (ref 4.0–10.5)
nRBC: 0 % (ref 0.0–0.2)

## 2020-12-23 LAB — COMPREHENSIVE METABOLIC PANEL
ALT: 28 U/L (ref 0–44)
AST: 26 U/L (ref 15–41)
Albumin: 4.4 g/dL (ref 3.5–5.0)
Alkaline Phosphatase: 65 U/L (ref 38–126)
Anion gap: 10 (ref 5–15)
BUN: 13 mg/dL (ref 6–20)
CO2: 25 mmol/L (ref 22–32)
Calcium: 9.3 mg/dL (ref 8.9–10.3)
Chloride: 104 mmol/L (ref 98–111)
Creatinine, Ser: 0.76 mg/dL (ref 0.44–1.00)
GFR, Estimated: >60 mL/min (ref >60)
Glucose, Bld: 96 mg/dL (ref 70–99)
Potassium: 4 mmol/L (ref 3.5–5.1)
Sodium: 139 mmol/L (ref 135–145)
Total Bilirubin: 1.6 mg/dL — ABNORMAL HIGH (ref 0.3–1.2)
Total Protein: 7.2 g/dL (ref 6.5–8.1)

## 2020-12-23 LAB — HCG, QUANTITATIVE, PREGNANCY: hCG, Beta Chain, Quant, S: 3506 m[IU]/mL — ABNORMAL HIGH (ref <5)

## 2020-12-23 MED ORDER — OXYCODONE HCL 5 MG PO TABS: 5.0000 mg | ORAL_TABLET | Freq: Four times a day (QID) | ORAL | 0 refills | Status: DC | PRN

## 2020-12-23 NOTE — ED Triage Notes (Signed)
PT to ED via POV. PT was dx with ectopic L side pregnancy last night, given methotrexate and d/c home. PT back today d/t continued pain and new intermittent sharp pain.

## 2020-12-23 NOTE — ED Notes (Signed)
Discharge papers given to patient and reviewed by EDP Funke at this time. Pt visualized in NAD while in the lobby.

## 2020-12-23 NOTE — ED Notes (Signed)
Pt discharged by dr Fuller Plan

## 2020-12-23 NOTE — ED Provider Notes (Signed)
[facility] Emergency Department Provider Note  ____________________________________________   Event Date/Time   First MD Initiated Contact with Patient 12/23/20 1737     (approximate)  I have reviewed the triage vital signs and the nursing notes.   HISTORY  Chief Complaint Ectopic Pregnancy    HPI Kathryn Wong is a 25 y.o. female G1 who comes in with concerns for abdominal pain.  Patient was seen yesterday and was diagnosed with an ectopic pregnancy.  She was treated with methotrexate.  She returns to the ER today because she states that the pain had gotten worse.  The pain is constantly there but then she has intermittently had sharp pains that are moderate, constant, nothing makes it better including Tylenol, nothing makes it worse.  Denies any chest pain, shortness of breath.  Her vaginal bleeding has slowed down.  She called the midwife who told her to come to the ER to be evaluated.          Past Medical History:  Diagnosis Date  . Migraine     Patient Active Problem List   Diagnosis Date Noted  . Intractable migraine without aura and without status migrainosus 10/08/2019    Past Surgical History:  Procedure Laterality Date  . WISDOM TOOTH EXTRACTION      Prior to Admission medications   Medication Sig Start Date End Date Taking? Authorizing Provider  AJOVY 225 MG/1.5ML SOAJ Inject 1 Syringe into the skin every 28 (twenty-eight) days. 05/18/20   [provider]  Butalbital-APAP-Caff-Cod 50-300-40-30 MG CAPS Take by mouth.    [provider]  ibuprofen (ADVIL) 600 MG tablet Take 1 tablet (600 mg total) by mouth every 6 (six) hours as needed. 06/11/20   Domenick Gong, MD  levonorgestrel-ethinyl estradiol (ALESSE) 0.1-20 MG-MCG tablet Take by mouth. 05/10/19   [provider]  rizatriptan (MAXALT) 10 MG tablet Take by mouth. 12/23/19   [provider]    Allergies Patient has no known  allergies.  Family History  Problem Relation Age of Onset  . Healthy Mother   . Healthy Father     Social History Social History   Tobacco Use  . Smoking status: Never Smoker  . Smokeless tobacco: Never Used  Vaping Use  . Vaping Use: Never used  Substance Use Topics  . Alcohol use: Yes    Comment: socially  . Drug use: Never      Review of Systems Constitutional: No fever/chills Eyes: No visual changes. ENT: No sore throat. Cardiovascular: Denies chest pain. Respiratory: Denies shortness of breath. Gastrointestinal: Abdominal pain no nausea, no vomiting.  No diarrhea.  No constipation. Genitourinary: Negative for dysuria.  Vaginal bleeding resolving Musculoskeletal: Negative for back pain. Skin: Negative for rash. Neurological: Negative for headaches, focal weakness or numbness. All other ROS negative ____________________________________________   PHYSICAL EXAM:  VITAL SIGNS: ED Triage Vitals  Enc Vitals Group     BP 12/23/20 1436 124/72     Pulse Rate 12/23/20 1436 75     Resp 12/23/20 1436 18     Temp 12/23/20 1436 98 F (36.7 C)     Temp Source 12/23/20 1436 Oral     SpO2 12/23/20 1436 97 %     Weight 12/23/20 1437 154 lb 12.2 oz (70.2 kg)     Height 12/23/20 1437 5\' 3"  (1.6 m)     Head Circumference --      Peak Flow --      Pain Score 12/23/20 1437 7  Pain Loc --      Pain Edu? --      Excl. in GC? --     Constitutional: Alert and oriented. Well appearing and in no acute distress. Eyes: Conjunctivae are normal. EOMI. Head: Atraumatic. Nose: No congestion/rhinnorhea. Mouth/Throat: Mucous membranes are moist.   Neck: No stridor. Trachea Midline. FROM Cardiovascular: Normal rate, regular rhythm. Grossly normal heart sounds.  Good peripheral circulation. Respiratory: Normal respiratory effort.  No retractions. Lungs CTAB. Gastrointestinal: Soft and nontender. No distention. No abdominal bruits.  Musculoskeletal: No lower extremity tenderness  nor edema.  No joint effusions. Neurologic:  Normal speech and language. No gross focal neurologic deficits are appreciated.  Skin:  Skin is warm, dry and intact. No rash noted. Psychiatric: Mood and affect are normal. Speech and behavior are normal. GU: Deferred   ____________________________________________   LABS (all labs ordered are listed, but only abnormal results are displayed)  Labs Reviewed  HCG, QUANTITATIVE, PREGNANCY - Abnormal; Notable for the following components:      Result Value   hCG, Beta Chain, Quant, S 3,506 (*)    All other components within normal limits  COMPREHENSIVE METABOLIC PANEL - Abnormal; Notable for the following components:   Total Bilirubin 1.6 (*)    All other components within normal limits  CBC   ____________________________________________   RADIOLOGY   Official radiology report(s): US OB LESS THAN 14 WEEKS WITH OB TRANSVAGINAL  Result Date: 12/23/2020 CLINICAL DATA:  Known ectopic gestation with pelvic pain. Patient given methotrexate 1 day prior EXAM: OBSTETRIC <14 WK [place] AND TRANSVAGINAL OB US TECHNIQUE: Both transabdominal and transvaginal ultrasound examinations were performed for complete evaluation of the gestation as well as the maternal uterus, adnexal regions, and pelvic cul-de-sac. Transvaginal technique was performed to assess early pregnancy. COMPARISON:  None. FINDINGS: Intrauterine gestational sac: Not visualized Yolk sac: Not visualized Embryo: Not visualized Cardiac Activity: Not visualized Subchorionic hemorrhage:  None visualized. Maternal uterus/adnexae: The known ectopic gestation in the left adnexa measures 5 mm in length, compared to 6 mm in length 1 day prior. This known ectopic gestation does not show fetal heart activity currently. Is less well-defined than on 1 day prior study. The ectopic gestational sac within the left adnexa is unchanged. Right ovary appears normal. Moderate free fluid is noted in the cul-de-sac region,  similar to 1 day prior. IMPRESSION: Known ectopic gestation again noted on the left, less well-defined than 1 day prior. No fetal heart activity identified. Suspect change in appearance from 1 day prior is due to the methotrexate administration. Gestational sac in this area is stable. Moderate fluid in the cul-de-sac appear stable compared to 1 day prior. No intrauterine gestation identified. Uterus appears normal. Right ovary appears normal. Electronically Signed   By: Bretta Bang III M.D.   On: 12/23/2020 15:52   US OB LESS THAN 14 WEEKS WITH OB TRANSVAGINAL  Result Date: 12/22/2020 CLINICAL DATA:  Vaginal bleeding EXAM: OBSTETRIC <14 WK [place] AND TRANSVAGINAL OB US TECHNIQUE: Both transabdominal and transvaginal ultrasound examinations were performed for complete evaluation of the gestation as well as the maternal uterus, adnexal regions, and pelvic cul-de-sac. Transvaginal technique was performed to assess early pregnancy. COMPARISON:  None. FINDINGS: Intrauterine gestational sac: None Yolk sac:  Not Visualized. Embryo:  Visualized within the left adnexa. Cardiac Activity: Not Visualized. Heart Rate: N/A  bpm CRL:  6.4 mm   6 w   3 d  [place] EDC: N/A Subchorionic hemorrhage:  Moderate Maternal uterus/adnexae: The right ovary measures 2.9 cm x 1.6 cm x 2.3 cm and is normal in appearance. The left ovary measures 2.7 cm x 2.0 cm x 2.5 cm and is normal in appearance. A 1.5 cm x 1.7 cm left adnexal gestational sac is seen, adjacent to the left ovary. This contains a fetal pole which measures 6.4 mm. No fetal cardiac activity is identified. IMPRESSION: 1. Findings consistent with a nonviable left adnexal ectopic pregnancy, at approximately 6 weeks and 3 days gestation by ultrasound evaluation. Electronically Signed   By: Aram Candela M.D.   On: 12/22/2020 21:12    ____________________________________________   PROCEDURES  Procedure(s) performed (including Critical  Care):  Procedures   ____________________________________________   INITIAL IMPRESSION / ASSESSMENT AND PLAN / ED COURSE  Kathryn Wong was evaluated in Emergency Department on 12/23/2020 for the symptoms described in the history of present illness. She was evaluated in the context of the global COVID-19 pandemic, which necessitated consideration that the patient might be at risk for infection with the SARS-CoV-2 virus that causes COVID-19. Institutional protocols and algorithms that pertain to the evaluation of patients at risk for COVID-19 are in a state of rapid change based on information released by regulatory bodies including the CDC and federal and state organizations. These policies and algorithms were followed during the patient's care in the ED.     Patient is a well-appearing 25 year old who comes in with concern for worsening pain in the setting of her known ectopic pregnancy.  Will get repeat hCG to make sure that is downtrending.  Will get labs to evaluate for anemia.  Will get repeat ultrasound to make sure that there is not worse hemoperitoneum.  At this time I have low suspicion for other acute pathology such as appendicitis, SBO.  Patient was seen from triage due to no rooms being available  hCG is slightly uptrending to 3500 from 3000.  Transvaginal ultrasound shows ectopic pregnancy without a heartbeat.  Stable amount of free fluid.  Discussed with the [facility] clinic OB team to let them know about patient being here.  However I suspect patient will be able to be discharged home given she is hemodynamically stable with stable hemoglobin.  Discussed with Dr. Dalbert Garnet.  She feels comfortable patient going home at this time given how well-appearing she is appears in reassuring work-up.  We will give a few doses of oxycodone to help with pain.  Patient will be discharged home at this time and felt comfortable with this plan.  She has follow-up on Thursday.  She feels comfortable  going home      ____________________________________________   FINAL CLINICAL IMPRESSION(S) / ED DIAGNOSES   Final diagnoses:  Left tubal pregnancy, unspecified whether intrauterine pregnancy present      MEDICATIONS GIVEN DURING THIS VISIT:  Medications - No data to display   ED Discharge Orders         Ordered    oxyCODONE (ROXICODONE) 5 MG immediate release tablet  Every 6 hours PRN        12/23/20 1752           Note:  This document was prepared using Dragon voice recognition software and may include unintentional dictation errors.   Concha Se, MD 12/23/20 [phone]

## 2020-12-23 NOTE — Discharge Instructions (Addendum)
TECHNIQUE:  Both transabdominal and transvaginal ultrasound examinations were  performed for complete evaluation of the gestation as well as the  maternal uterus, adnexal regions, and pelvic cul-de-sac.  Transvaginal technique was performed to assess early pregnancy.   COMPARISON:  None.   FINDINGS:  Intrauterine gestational sac: Not visualized   Yolk sac: Not visualized   Embryo: Not visualized   Cardiac Activity: Not visualized   Subchorionic hemorrhage:  None visualized.   Everything looks stable at this time. You may have additional pain. You should take the oxycodone to help with it. They will follow-up with you in the clinic. Return to the ER if you feel lightheaded, fatigue or any other concerns   Maternal uterus/adnexae: The known ectopic gestation in the left  adnexa measures 5 mm in length, compared to 6 mm in length 1 day  prior. This known ectopic gestation does not show fetal heart  activity currently. Is less well-defined than on 1 day prior study.  The ectopic gestational sac within the left adnexa is unchanged.   Right ovary appears normal. Moderate free fluid is noted in the  cul-de-sac region, similar to 1 day prior.   IMPRESSION:  Known ectopic gestation again noted on the left, less well-defined  than 1 day prior. No fetal heart activity identified. Suspect change  in appearance from 1 day prior is due to the methotrexate  administration. Gestational sac in this area is stable.   Moderate fluid in the cul-de-sac appear stable compared to 1 day  prior.   No intrauterine gestation identified. Uterus appears normal. Right  ovary appears normal.

## 2020-12-23 NOTE — Progress Notes (Signed)
RN administered methotrexate at 2350 on 12/22/20. RN sat with pt for 20 minutes and no adverse reaction transpired. Pt stable. ED RN notified of patients readiness for discharge.

## 2020-12-24 ENCOUNTER — Other Ambulatory Visit: Payer: Self-pay | Admitting: Obstetrics & Gynecology

## 2020-12-24 ENCOUNTER — Observation Stay
Admission: AD | Admit: 2020-12-24 | Discharge: 2020-12-26 | Disposition: A | Discharge status: Home / Self Care | Payer: BC Managed Care – PPO | Source: Ambulatory Visit | Attending: Obstetrics & Gynecology | Admitting: Obstetrics & Gynecology

## 2020-12-24 ENCOUNTER — Encounter: Payer: Self-pay | Admitting: Obstetrics & Gynecology

## 2020-12-24 DIAGNOSIS — O009 Unspecified ectopic pregnancy without intrauterine pregnancy: Secondary | ICD-10-CM

## 2020-12-24 DIAGNOSIS — Z3A01 Less than 8 weeks gestation of pregnancy: Secondary | ICD-10-CM | POA: Insufficient documentation

## 2020-12-24 DIAGNOSIS — Z20822 Contact with and (suspected) exposure to covid-19: Secondary | ICD-10-CM | POA: Diagnosis not present

## 2020-12-24 DIAGNOSIS — O00101 Right tubal pregnancy without intrauterine pregnancy: Secondary | ICD-10-CM

## 2020-12-24 DIAGNOSIS — O00102 Left tubal pregnancy without intrauterine pregnancy: Secondary | ICD-10-CM

## 2020-12-24 HISTORY — DX: Unspecified ectopic pregnancy without intrauterine pregnancy: O00.90

## 2020-12-24 MED ORDER — ONDANSETRON HCL 4 MG PO TABS
4.0000 mg | ORAL_TABLET | Freq: Four times a day (QID) | ORAL | Status: DC | PRN
  Administered 2020-12-24: 4 mg via ORAL
  Filled 2020-12-24: qty 1

## 2020-12-24 MED ORDER — ACETAMINOPHEN 325 MG PO TABS
650.0000 mg | ORAL_TABLET | ORAL | Status: DC | PRN
  Administered 2020-12-24 – 2020-12-26 (×8): 650 mg via ORAL
  Filled 2020-12-24 (×9): qty 2

## 2020-12-24 MED ORDER — ONDANSETRON HCL 4 MG/2ML IJ SOLN: 4.0000 mg | Freq: Four times a day (QID) | INTRAMUSCULAR | Status: DC | PRN

## 2020-12-24 NOTE — H&P (Signed)
Consult History and Physical   SERVICE: Obstetrics and Gynecology   Patient Name: CHARITIE HINOTE Patient MRN:   [id number]  CC: ectopic pregnancy, with pain  HPI: FORESTINE MACHO is a 25 y.o. G1P0 with recently diagnosed left ectopic pregnancy and methotrexate administration on 12/23/20.  She has experienced some intermittent debilitating pain which resolves. Ultrasound in office today showed an intact gestational sac adjacent to the left ovary, with a 2.5cm surrounding area consistent with placenta vs clot. CRL measurses [redacted]w[redacted]d.  There was minimal free fluid, and no signs of decompensation.   She lives from here, and given her persistent put intermittent pain I advised admission for observation until methotrexate could begin to be effective (4 days post admin).  She agreed.   Rh pos  Review of Systems: positives in bold GEN:   fevers, chills, weight changes, appetite changes, fatigue, night sweats HEENT:  HA, vision changes, hearing loss, congestion, rhinorrhea, sinus pressure, dysphagia CV:   CP, palpitations PULM:  SOB, cough GI:  abd pain, N/V/D/C GU:  dysuria, urgency, frequency MSK:  arthralgias, myalgias, back pain, swelling SKIN:  rashes, color changes, pallor NEURO:  numbness, weakness, tingling, seizures, dizziness, tremors PSYCH:  depression, anxiety, behavioral problems, confusion  HEME/LYMPH:  easy bruising or bleeding ENDO:  heat/cold intolerance  Past Obstetrical History: OB History    Gravida  1   Para      Term      Preterm      AB      Living        SAB      IAB      Ectopic      Multiple      Live Births              Past Gynecologic History: Patient's last menstrual period was 10/22/2020 (approximate).   Past Medical History: Past Medical History:  Diagnosis Date  . Migraine     Past Surgical History:   Past Surgical History:  Procedure Laterality Date  . WISDOM TOOTH EXTRACTION      Family History:  family history  includes Healthy in her father and mother.  Social History:  Social History   Socioeconomic History  . Marital status: Single    Spouse name: Not on file  . Number of children: Not on file  . Years of education: Not on file  . Highest education level: Not on file  Occupational History  . Not on file  Tobacco Use  . Smoking status: Never Smoker  . Smokeless tobacco: Never Used  Vaping Use  . Vaping Use: Never used  Substance and Sexual Activity  . Alcohol use: Yes    Comment: socially  . Drug use: Never  . Sexual activity: Not on file  Other Topics Concern  . Not on file  Social History Narrative  . Not on file   Social Determinants of Health   Financial Resource Strain: Not on file  Food Insecurity: Not on file  Transportation Needs: Not on file  Physical Activity: Not on file  Stress: Not on file  Social Connections: Not on file  Intimate Partner Violence: Not on file    Home Medications:  Medications reconciled in EPIC  No current facility-administered medications on file prior to encounter.   Current Outpatient Medications on File Prior to Encounter  Medication Sig Dispense Refill  . AJOVY 225 MG/1.5ML SOAJ Inject 1 Syringe into the skin every 28 (twenty-eight) days.    . Butalbital-APAP-Caff-Cod 50-300-40-30  MG CAPS Take by mouth.    Marland Kitchen ibuprofen (ADVIL) 600 MG tablet Take 1 tablet (600 mg total) by mouth every 6 (six) hours as needed. 30 tablet 0  . levonorgestrel-ethinyl estradiol (ALESSE) 0.1-20 MG-MCG tablet Take by mouth.    . oxyCODONE (ROXICODONE) 5 MG immediate release tablet Take 1 tablet (5 mg total) by mouth every 6 (six) hours as needed for up to 3 days. 12 tablet 0  . rizatriptan (MAXALT) 10 MG tablet Take by mouth.      Allergies:  No Known Allergies  Physical Exam:  Temp:  [98.3 F (36.8 C)-98.5 F (36.9 C)] 98.3 F (36.8 C) (01/18 1500) Pulse Rate:  [67-76] 71 (01/18 1500) Resp:  [18-20] 18 (01/18 1500) BP: (110-115)/(65-78) 110/75  (01/18 1500) SpO2:  [100 %] 100 % (01/18 1500) Weight:  [69.9 kg] 69.9 kg (01/18 1330)   General Appearance:  Well developed, well nourished, no acute distress, alert and oriented, cooperative and appears stated age HEENT:  Normocephalic atraumatic, extraocular movements intact, moist mucous membranes, neck supple with midline trachea and thyroid without masses Cardiovascular:  Normal S1/S2, regular rate and rhythm, no murmurs, 2+ distal pulses Pulmonary:  clear to auscultation, no wheezes, rales or rhonchi, symmetric air entry, good air exchange Abdomen:  Bowel sounds present, soft, nontender, nondistended, no abnormal masses or organomegaly, no epigastric pain.  Tender in LLQ Back: inspection of back is normal Extremities:  extremities normal, no tenderness, atraumatic, no cyanosis or edema Skin:  normal coloration and turgor, no rashes, no suspicious skin lesions noted  Neurologic:  Cranial nerves 2-12 grossly intact, grossly equal strength and muscle tone, normal speech, no focal findings or movement disorder noted. Psychiatric:  Normal mood and affect, appropriate, no AH/VH Pelvic:  Deferred due to ectopic   Labs/Studies:   see results Beta 1/17: 3500 Hb: 13.3 Plt: 317  Imaging: US OB LESS THAN 14 WEEKS WITH OB TRANSVAGINAL  Result Date: 12/23/2020 CLINICAL DATA:  Known ectopic gestation with pelvic pain. Patient given methotrexate 1 day prior EXAM: OBSTETRIC <14 WK [place] AND TRANSVAGINAL OB US TECHNIQUE: Both transabdominal and transvaginal ultrasound examinations were performed for complete evaluation of the gestation as well as the maternal uterus, adnexal regions, and pelvic cul-de-sac. Transvaginal technique was performed to assess early pregnancy. COMPARISON:  None. FINDINGS: Intrauterine gestational sac: Not visualized Yolk sac: Not visualized Embryo: Not visualized Cardiac Activity: Not visualized Subchorionic hemorrhage:  None visualized. Maternal uterus/adnexae: The known  ectopic gestation in the left adnexa measures 5 mm in length, compared to 6 mm in length 1 day prior. This known ectopic gestation does not show fetal heart activity currently. Is less well-defined than on 1 day prior study. The ectopic gestational sac within the left adnexa is unchanged. Right ovary appears normal. Moderate free fluid is noted in the cul-de-sac region, similar to 1 day prior. IMPRESSION: Known ectopic gestation again noted on the left, less well-defined than 1 day prior. No fetal heart activity identified. Suspect change in appearance from 1 day prior is due to the methotrexate administration. Gestational sac in this area is stable. Moderate fluid in the cul-de-sac appear stable compared to 1 day prior. No intrauterine gestation identified. Uterus appears normal. Right ovary appears normal. Electronically Signed   By: Bretta Bang III M.D.   On: 12/23/2020 15:52   US OB LESS THAN 14 WEEKS WITH OB TRANSVAGINAL  Result Date: 12/22/2020 CLINICAL DATA:  Vaginal bleeding EXAM: OBSTETRIC <14 WK [place] AND TRANSVAGINAL OB US TECHNIQUE: Both  transabdominal and transvaginal ultrasound examinations were performed for complete evaluation of the gestation as well as the maternal uterus, adnexal regions, and pelvic cul-de-sac. Transvaginal technique was performed to assess early pregnancy. COMPARISON:  None. FINDINGS: Intrauterine gestational sac: None Yolk sac:  Not Visualized. Embryo:  Visualized within the left adnexa. Cardiac Activity: Not Visualized. Heart Rate: N/A  bpm CRL:  6.4 mm   6 w   3 d                  [place] EDC: N/A Subchorionic hemorrhage:  Moderate Maternal uterus/adnexae: The right ovary measures 2.9 cm x 1.6 cm x 2.3 cm and is normal in appearance. The left ovary measures 2.7 cm x 2.0 cm x 2.5 cm and is normal in appearance. A 1.5 cm x 1.7 cm left adnexal gestational sac is seen, adjacent to the left ovary. This contains a fetal pole which measures 6.4 mm. No fetal cardiac activity is  identified. IMPRESSION: 1. Findings consistent with a nonviable left adnexal ectopic pregnancy, at approximately 6 weeks and 3 days gestation by ultrasound evaluation. Electronically Signed   By: Aram Candela M.D.   On: 12/22/2020 21:12     Assessment / Plan:   MAZIYAH VESSEL is a 25 y.o. G1P0 who presents with left ectopic pregnancy  1. Observation - minimal use of pain control in order to identify any worsening of her pain symptoms.   2. May have light fare, but if pain worsens make NPO.  Recheck beta hcg Thursday ----- Ranae Plumber, MD Attending Obstetrician and Gynecologist [facility], Department of OB/GYN [facility]

## 2020-12-25 DIAGNOSIS — O009 Unspecified ectopic pregnancy without intrauterine pregnancy: Secondary | ICD-10-CM | POA: Diagnosis not present

## 2020-12-25 LAB — URINALYSIS, COMPLETE (UACMP) WITH MICROSCOPIC
Bacteria, UA: NONE SEEN
Bilirubin Urine: NEGATIVE
Glucose, UA: NEGATIVE mg/dL
Ketones, ur: NEGATIVE mg/dL
Leukocytes,Ua: NEGATIVE
Nitrite: NEGATIVE
Protein, ur: NEGATIVE mg/dL
Specific Gravity, Urine: 1.024 (ref 1.005–1.030)
pH: 6 (ref 5.0–8.0)

## 2020-12-25 LAB — SARS CORONAVIRUS 2 (TAT 6-24 HRS): SARS Coronavirus 2: NEGATIVE

## 2020-12-25 NOTE — Progress Notes (Signed)
Obstetric and Gynecology  Subjective  Kathryn Wong is a 26 y.o. female G1P0 who presented on 12/24/2020 for ectopic pregnancy.    She has experienced some intermittent debilitating pain which resolves. Ultrasound in office today showed an intact gestational sac adjacent to the left ovary, with a 2.5cm surrounding area consistent with placenta vs clot. CRL measurses [redacted]w[redacted]d.  There was minimal free fluid, and no signs of decompensation.   She lives from the hospital and agreed for inpatient monitoring of symptoms.   Objective   Vitals:   12/25/20 0336 12/25/20 0753  BP: (!) 97/44 110/65  Pulse: 66 78  Resp:  20  Temp: 98.4 F (36.9 C) 98 F (36.7 C)  SpO2: 100% 99%     Intake/Output Summary (Last 24 hours) at 12/25/2020 1311 Last data filed at 12/25/2020 1130 Gross per 24 hour  Intake --  Output 1050 ml  Net -1050 ml    General: NAD Cardiovascular: RRR, no murmurs Pulmonary: CTAB Abdomen: Tender in LLQ, +BS, no guarding. Extremities: No erythema or cords, no calf tenderness, +warmth with normal peripheral pulses.  Labs: Results for orders placed or performed during the hospital encounter of 12/24/20 (from the past 24 hour(s))  SARS CORONAVIRUS 2 (TAT 6-24 HRS) Nasopharyngeal Nasopharyngeal Swab     Status: None   Collection Time: 12/24/20  1:31 PM   Specimen: Nasopharyngeal Swab  Result Value Ref Range   SARS Coronavirus 2 NEGATIVE NEGATIVE  Urinalysis, Complete w Microscopic Urine, Clean Catch     Status: Abnormal   Collection Time: 12/25/20 11:45 AM  Result Value Ref Range   Color, Urine YELLOW (A) YELLOW   APPearance HAZY (A) CLEAR   Specific Gravity, Urine 1.024 1.005 - 1.030   pH 6.0 5.0 - 8.0   Glucose, UA NEGATIVE NEGATIVE mg/dL   Hgb urine dipstick MODERATE (A) NEGATIVE   Bilirubin Urine NEGATIVE NEGATIVE   Ketones, ur NEGATIVE NEGATIVE mg/dL   Protein, ur NEGATIVE NEGATIVE mg/dL   Nitrite NEGATIVE NEGATIVE   Leukocytes,Ua NEGATIVE NEGATIVE   RBC  / HPF 0-5 0 - 5 RBC/hpf   WBC, UA 0-5 0 - 5 WBC/hpf   Bacteria, UA NONE SEEN NONE SEEN   Squamous Epithelial / LPF 0-5 0 - 5   Mucus PRESENT     Cultures: Results for orders placed or performed during the hospital encounter of 12/24/20  SARS CORONAVIRUS 2 (TAT 6-24 HRS) Nasopharyngeal Nasopharyngeal Swab     Status: None   Collection Time: 12/24/20  1:31 PM   Specimen: Nasopharyngeal Swab  Result Value Ref Range Status   SARS Coronavirus 2 NEGATIVE NEGATIVE Final    Comment: (NOTE) SARS-CoV-2 target nucleic acids are NOT DETECTED.  The SARS-CoV-2 RNA is generally detectable in upper and lower respiratory specimens during the acute phase of infection. Negative results do not preclude SARS-CoV-2 infection, do not rule out co-infections with other pathogens, and should not be used as the sole basis for treatment or other patient management decisions. Negative results must be combined with clinical observations, patient history, and epidemiological information. The expected result is Negative.  Fact Sheet for Patients: HairSlick.no  Fact Sheet for Healthcare Providers: quierodirigir.com  This test is not yet approved or cleared by the [place] FDA and  has been authorized for detection and/or diagnosis of SARS-CoV-2 by FDA under an Emergency Use Authorization (EUA). This EUA will remain  in effect (meaning this test can be used) for the duration of the COVID-19 declaration under Se ction  564(b)(1) of the Act, 21 U.S.C. section 360bbb-3(b)(1), unless the authorization is terminated or revoked sooner.  Performed at [facility], [address], [place], [place] [address]     Imaging: US OB LESS THAN 14 WEEKS WITH OB TRANSVAGINAL  Result Date: 12/23/2020 CLINICAL DATA:  Known ectopic gestation with pelvic pain. Patient given methotrexate 1 day prior EXAM: OBSTETRIC <14 WK [place] AND TRANSVAGINAL OB US TECHNIQUE: Both  transabdominal and transvaginal ultrasound examinations were performed for complete evaluation of the gestation as well as the maternal uterus, adnexal regions, and pelvic cul-de-sac. Transvaginal technique was performed to assess early pregnancy. COMPARISON:  None. FINDINGS: Intrauterine gestational sac: Not visualized Yolk sac: Not visualized Embryo: Not visualized Cardiac Activity: Not visualized Subchorionic hemorrhage:  None visualized. Maternal uterus/adnexae: The known ectopic gestation in the left adnexa measures 5 mm in length, compared to 6 mm in length 1 day prior. This known ectopic gestation does not show fetal heart activity currently. Is less well-defined than on 1 day prior study. The ectopic gestational sac within the left adnexa is unchanged. Right ovary appears normal. Moderate free fluid is noted in the cul-de-sac region, similar to 1 day prior. IMPRESSION: Known ectopic gestation again noted on the left, less well-defined than 1 day prior. No fetal heart activity identified. Suspect change in appearance from 1 day prior is due to the methotrexate administration. Gestational sac in this area is stable. Moderate fluid in the cul-de-sac appear stable compared to 1 day prior. No intrauterine gestation identified. Uterus appears normal. Right ovary appears normal. Electronically Signed   By: Bretta Bang III M.D.   On: 12/23/2020 15:52   US OB LESS THAN 14 WEEKS WITH OB TRANSVAGINAL  Result Date: 12/22/2020 CLINICAL DATA:  Vaginal bleeding EXAM: OBSTETRIC <14 WK [place] AND TRANSVAGINAL OB US TECHNIQUE: Both transabdominal and transvaginal ultrasound examinations were performed for complete evaluation of the gestation as well as the maternal uterus, adnexal regions, and pelvic cul-de-sac. Transvaginal technique was performed to assess early pregnancy. COMPARISON:  None. FINDINGS: Intrauterine gestational sac: None Yolk sac:  Not Visualized. Embryo:  Visualized within the left adnexa. Cardiac  Activity: Not Visualized. Heart Rate: N/A  bpm CRL:  6.4 mm   6 w   3 d                  [place] EDC: N/A Subchorionic hemorrhage:  Moderate Maternal uterus/adnexae: The right ovary measures 2.9 cm x 1.6 cm x 2.3 cm and is normal in appearance. The left ovary measures 2.7 cm x 2.0 cm x 2.5 cm and is normal in appearance. A 1.5 cm x 1.7 cm left adnexal gestational sac is seen, adjacent to the left ovary. This contains a fetal pole which measures 6.4 mm. No fetal cardiac activity is identified. IMPRESSION: 1. Findings consistent with a nonviable left adnexal ectopic pregnancy, at approximately 6 weeks and 3 days gestation by ultrasound evaluation. Electronically Signed   By: Aram Candela M.D.   On: 12/22/2020 21:12     Assessment   24 y.o. [facility] Day: 2, with ectopic pregnancy, s/p one dose of methotrexate.    Plan   1. Continue to monitor closely for pain and vaginal bleeding.  2. Currently stable, pain controlled with Tylenol.   3. Will repeat beta hcg tomorrow morning.  4. Labs tomorrow will help determine if another dose of methotrexate is needed or if surgical management should be considered.   5. Dr. Elesa Massed updated on patient  Drinda Butts, CNM Certified Nurse Midwife [facility]

## 2020-12-26 DIAGNOSIS — O009 Unspecified ectopic pregnancy without intrauterine pregnancy: Secondary | ICD-10-CM | POA: Diagnosis not present

## 2020-12-26 LAB — HCG, QUANTITATIVE, PREGNANCY: hCG, Beta Chain, Quant, S: 3213 m[IU]/mL — ABNORMAL HIGH (ref <5)

## 2020-12-26 MED ORDER — ONDANSETRON HCL 4 MG PO TABS: 4.0000 mg | ORAL_TABLET | Freq: Four times a day (QID) | ORAL | 0 refills | Status: DC | PRN

## 2020-12-26 MED ORDER — SENNOSIDES-DOCUSATE SODIUM 8.6-50 MG PO TABS
2.0000 | ORAL_TABLET | Freq: Every day | ORAL | Status: DC
  Administered 2020-12-26: 2 via ORAL
  Filled 2020-12-26: qty 2

## 2020-12-26 MED ORDER — SENNOSIDES-DOCUSATE SODIUM 8.6-50 MG PO TABS: 2.0000 | ORAL_TABLET | Freq: Every day | ORAL | 0 refills | Status: DC

## 2020-12-26 MED ORDER — ACETAMINOPHEN 325 MG PO TABS: 650.0000 mg | ORAL_TABLET | ORAL | Status: DC | PRN

## 2020-12-26 NOTE — Discharge Summary (Signed)
Obstetric and Gynecology  Subjective  Kathryn Wong is a 25 y.o. female G1P0 who presented on 12/24/2020 for pain related to known left ectopic pregnancy.    She has experienced improvement of pain since yesterday, has only needed 1 dose of tylenol today for pelvic pain. Reports she has mild pain more on left side. Also having constipation, has not had a BM in several days. Reports light vaginal spotting intermittently.  Objective   Vitals:   12/26/20 0745 12/26/20 1125  BP: 97/63 107/60  Pulse: 61 60  Resp: 20 18  Temp: 98.2 F (36.8 C) 97.9 F (36.6 C)  SpO2: 100%      Intake/Output Summary (Last 24 hours) at 12/26/2020 1422 Last data filed at 12/26/2020 0500 Gross per 24 hour  Intake 300 ml  Output 700 ml  Net -400 ml    General: NAD Cardiovascular: RRR, no murmurs Pulmonary: CTAB Abdomen: Tender in LLQ, +BS, soft, no guarding. Extremities: No erythema or cords, no calf tenderness, +warmth with normal peripheral pulses.  Labs: Results for orders placed or performed during the hospital encounter of 12/24/20 (from the past 24 hour(s))  hCG, quantitative, pregnancy     Status: Abnormal   Collection Time: 12/26/20  4:24 AM  Result Value Ref Range   hCG, Beta Chain, Quant, S 3,213 (H) <5 mIU/mL    Cultures: Results for orders placed or performed during the hospital encounter of 12/24/20  SARS CORONAVIRUS 2 (TAT 6-24 HRS) Nasopharyngeal Nasopharyngeal Swab     Status: None   Collection Time: 12/24/20  1:31 PM   Specimen: Nasopharyngeal Swab  Result Value Ref Range Status   SARS Coronavirus 2 NEGATIVE NEGATIVE Final    Comment: (NOTE) SARS-CoV-2 target nucleic acids are NOT DETECTED.  The SARS-CoV-2 RNA is generally detectable in upper and lower respiratory specimens during the acute phase of infection. Negative results do not preclude SARS-CoV-2 infection, do not rule out co-infections with other pathogens, and should not be used as the sole basis for treatment or  other patient management decisions. Negative results must be combined with clinical observations, patient history, and epidemiological information. The expected result is Negative.  Fact Sheet for Patients: HairSlick.no  Fact Sheet for Healthcare Providers: quierodirigir.com  This test is not yet approved or cleared by the [place] FDA and  has been authorized for detection and/or diagnosis of SARS-CoV-2 by FDA under an Emergency Use Authorization (EUA). This EUA will remain  in effect (meaning this test can be used) for the duration of the COVID-19 declaration under Se ction 564(b)(1) of the Act, 21 U.S.C. section 360bbb-3(b)(1), unless the authorization is terminated or revoked sooner.  Performed at [facility], [address], [place], [place] [address]     Imaging: US OB LESS THAN 14 WEEKS WITH OB TRANSVAGINAL  Result Date: 12/23/2020 CLINICAL DATA:  Known ectopic gestation with pelvic pain. Patient given methotrexate 1 day prior EXAM: OBSTETRIC <14 WK [place] AND TRANSVAGINAL OB US TECHNIQUE: Both transabdominal and transvaginal ultrasound examinations were performed for complete evaluation of the gestation as well as the maternal uterus, adnexal regions, and pelvic cul-de-sac. Transvaginal technique was performed to assess early pregnancy. COMPARISON:  None. FINDINGS: Intrauterine gestational sac: Not visualized Yolk sac: Not visualized Embryo: Not visualized Cardiac Activity: Not visualized Subchorionic hemorrhage:  None visualized. Maternal uterus/adnexae: The known ectopic gestation in the left adnexa measures 5 mm in length, compared to 6 mm in length 1 day prior. This known ectopic gestation does not show  fetal heart activity currently. Is less well-defined than on 1 day prior study. The ectopic gestational sac within the left adnexa is unchanged. Right ovary appears normal. Moderate free fluid is noted in the  cul-de-sac region, similar to 1 day prior. IMPRESSION: Known ectopic gestation again noted on the left, less well-defined than 1 day prior. No fetal heart activity identified. Suspect change in appearance from 1 day prior is due to the methotrexate administration. Gestational sac in this area is stable. Moderate fluid in the cul-de-sac appear stable compared to 1 day prior. No intrauterine gestation identified. Uterus appears normal. Right ovary appears normal. Electronically Signed   By: Bretta Bang III M.D.   On: 12/23/2020 15:52   US OB LESS THAN 14 WEEKS WITH OB TRANSVAGINAL  Result Date: 12/22/2020 CLINICAL DATA:  Vaginal bleeding EXAM: OBSTETRIC <14 WK [place] AND TRANSVAGINAL OB US TECHNIQUE: Both transabdominal and transvaginal ultrasound examinations were performed for complete evaluation of the gestation as well as the maternal uterus, adnexal regions, and pelvic cul-de-sac. Transvaginal technique was performed to assess early pregnancy. COMPARISON:  None. FINDINGS: Intrauterine gestational sac: None Yolk sac:  Not Visualized. Embryo:  Visualized within the left adnexa. Cardiac Activity: Not Visualized. Heart Rate: N/A  bpm CRL:  6.4 mm   6 w   3 d                  [place] EDC: N/A Subchorionic hemorrhage:  Moderate Maternal uterus/adnexae: The right ovary measures 2.9 cm x 1.6 cm x 2.3 cm and is normal in appearance. The left ovary measures 2.7 cm x 2.0 cm x 2.5 cm and is normal in appearance. A 1.5 cm x 1.7 cm left adnexal gestational sac is seen, adjacent to the left ovary. This contains a fetal pole which measures 6.4 mm. No fetal cardiac activity is identified. IMPRESSION: 1. Findings consistent with a nonviable left adnexal ectopic pregnancy, at approximately 6 weeks and 3 days gestation by ultrasound evaluation. Electronically Signed   By: Aram Candela M.D.   On: 12/22/2020 21:12     Assessment   24 y.o. [facility] Day: 3, with ectopic pregnancy, s/p one dose of methotrexate given on  12/22/20.    Plan   1. Continue to monitor closely for pain and vaginal bleeding.  2. Currently stable, pain controlled with Tylenol.   3. Will repeat beta hcg as outpatient on Sunday 12/29/20 to determine if additional MTX needed 4. Dr Dalbert Garnet updated on pt and pt desires DC home.  5. Pt Needs f/u appt scheduled on 12/30/20, [facility] to contact pt.   Randa Ngo, CNM  Certified Nurse Midwife [facility]

## 2020-12-26 NOTE — Progress Notes (Signed)
Discharge order received from doctor. Reviewed discharge instructions and prescriptions with patient and answered all questions. Follow up appointment instructions given. Patient verbalized understanding. Patient discharged home via wheelchair by nursing/auxillary.

## 2020-12-27 LAB — BETA HCG QUANT (REF LAB): hCG Quant: 2017 m[IU]/mL

## 2020-12-29 ENCOUNTER — Other Ambulatory Visit
Admission: RE | Admit: 2020-12-29 | Discharge: 2020-12-29 | Disposition: A | Discharge status: Home / Self Care | Payer: BC Managed Care – PPO | Attending: Obstetrics and Gynecology | Admitting: Obstetrics and Gynecology

## 2020-12-29 DIAGNOSIS — Z3A00 Weeks of gestation of pregnancy not specified: Secondary | ICD-10-CM | POA: Diagnosis not present

## 2020-12-29 DIAGNOSIS — O009 Unspecified ectopic pregnancy without intrauterine pregnancy: Secondary | ICD-10-CM | POA: Insufficient documentation

## 2020-12-29 LAB — HCG, QUANTITATIVE, PREGNANCY: hCG, Beta Chain, Quant, S: 2821 m[IU]/mL — ABNORMAL HIGH (ref <5)

## 2020-12-31 ENCOUNTER — Ambulatory Visit
Admission: RE | Admit: 2020-12-31 | Discharge: 2020-12-31 | Disposition: A | Discharge status: Home / Self Care | Payer: BC Managed Care – PPO | Source: Ambulatory Visit | Attending: Obstetrics and Gynecology | Admitting: Obstetrics and Gynecology

## 2020-12-31 ENCOUNTER — Other Ambulatory Visit: Payer: Self-pay

## 2020-12-31 DIAGNOSIS — O009 Unspecified ectopic pregnancy without intrauterine pregnancy: Secondary | ICD-10-CM | POA: Insufficient documentation

## 2020-12-31 LAB — HCG, QUANTITATIVE, PREGNANCY: hCG, Beta Chain, Quant, S: 2598 m[IU]/mL — ABNORMAL HIGH (ref <5)

## 2020-12-31 MED ORDER — METHOTREXATE FOR ECTOPIC PREGNANCY
50.0000 mg/m2 | Freq: Once | INTRAMUSCULAR | Status: AC
  Administered 2020-12-31: 15:00:00 90 mg via INTRAMUSCULAR
  Filled 2020-12-31: qty 1

## 2020-12-31 NOTE — Discharge Instructions (Signed)
Following Methotrexate Administration for Ectopic Pregnancy  Your physician will obtain follow-up blood work to monitor the effect of the medication.   After receiving methotrexate avoid:  Alcoholic beverages  Vitamins containing folic acid Foods that contain folic acid, including fortified cereal, enriched bread and pasta, peanuts, dark green leafy vegetables, orange juice, and beans  Gas-forming foods  Nonsteroidal antiinflammatory painkillers  Sexual intercourse or any strenuous activity because it may cause the fallopian tube to rupture Do not become pregnant for 3 months to decrease the risk of birth defects.  You may experience side effects, like nausea, vomiting, dizziness, and mouth and lip ulcers.  Most women have abdominal pain a couple of days after the injection.  Notify your obstetric practitioner or return to the emergency department if you develop severe abdominal pain, dizziness or fainting, heavy vaginal bleeding, severe nausea and vomiting, or fever.  Double-flush the toilet with the lid closed for 72 hours after receiving the injection.   

## 2020-12-31 NOTE — Progress Notes (Signed)
Pt tolerated injection well, D/C instructions given to pt, verbalized understanding of D/C instructions. Discharged home via ambulation husband in car waiting for pt.

## 2021-01-06 ENCOUNTER — Other Ambulatory Visit: Payer: Self-pay

## 2021-01-06 ENCOUNTER — Ambulatory Visit
Admission: RE | Admit: 2021-01-06 | Discharge: 2021-01-06 | Disposition: A | Discharge status: Home / Self Care | Payer: BC Managed Care – PPO | Source: Ambulatory Visit | Attending: Obstetrics and Gynecology | Admitting: Obstetrics and Gynecology

## 2021-01-06 DIAGNOSIS — O00102 Left tubal pregnancy without intrauterine pregnancy: Secondary | ICD-10-CM | POA: Insufficient documentation

## 2021-01-06 DIAGNOSIS — R102 Pelvic and perineal pain: Secondary | ICD-10-CM | POA: Diagnosis not present

## 2021-01-06 DIAGNOSIS — Z3A00 Weeks of gestation of pregnancy not specified: Secondary | ICD-10-CM | POA: Insufficient documentation

## 2021-01-06 LAB — HCG, QUANTITATIVE, PREGNANCY: hCG, Beta Chain, Quant, S: 780 m[IU]/mL — ABNORMAL HIGH (ref <5)

## 2021-01-06 NOTE — Progress Notes (Signed)
Pt returning for Day 7 beta hcg after mtx.  Component     Latest Ref Rng & Units 12/22/2020 12/23/2020 12/26/2020 12/29/2020  HCG, Beta Chain, Quant, S     <5 mIU/mL 3,088 (H) 3,506 (H) 3,213 (H) 2,821 (H)   Component     Latest Ref Rng & Units 12/31/2020  HCG, Beta Chain, Quant, S     <5 mIU/mL 2,598 (H)   Pt requesting to leave prior to results returning. Will call her with results and make a plan for futher followu (3rd MTX dose vs surgery vs monitoring quants to zero)  Betas to fall to 2208 for a 15% drop.  Pt can leave after blood draw.

## 2021-04-17 ENCOUNTER — Encounter: Payer: Self-pay | Admitting: Obstetrics and Gynecology

## 2021-04-17 ENCOUNTER — Other Ambulatory Visit: Payer: Self-pay

## 2021-04-17 ENCOUNTER — Ambulatory Visit (INDEPENDENT_AMBULATORY_CARE_PROVIDER_SITE_OTHER): Payer: BC Managed Care – PPO | Admitting: Obstetrics and Gynecology

## 2021-04-17 DIAGNOSIS — Z8759 Personal history of other complications of pregnancy, childbirth and the puerperium: Secondary | ICD-10-CM

## 2021-04-17 DIAGNOSIS — N911 Secondary amenorrhea: Secondary | ICD-10-CM | POA: Diagnosis not present

## 2021-04-17 DIAGNOSIS — N879 Dysplasia of cervix uteri, unspecified: Secondary | ICD-10-CM

## 2021-04-17 DIAGNOSIS — Z3169 Encounter for other general counseling and advice on procreation: Secondary | ICD-10-CM

## 2021-04-17 NOTE — Progress Notes (Signed)
Gynecology Pelvic Pain Evaluation   Chief Complaint:  Chief Complaint  Patient presents with  . Gynecologic Exam    Self referral hx of ectopic 12/2020 - Lt side abdomen pain. Procedure RM    History of Present Illness:   Patient is a 25 y.o. G1P0 who LMP was Patient's last menstrual period was 10/22/2020 (approximate)., presents today for a problem visit.  The patient presents for evaluation f secondary amenorrhea following medical management of a ectopic pregnancy 1/22.  She was followed with serial HCG which dropped appropriately until negative.  She has not resumed menstruation although she does report bleeding starting today which would be her first LMP since the ectopic pregnancy diagnosis other than some light spotting.    She does report some left lower quadrant discomfort.  She is actively trying to conceive and is worried about her potential risk of future ectopic, fertility issues, and tubal patency.    She has not identifiable risk factors for ectopic.  Specifically she denies history of GC/CT/PID, no history of endometriosis, no prior pelvic surgery, and no history of ectopic preceding this ectopic.    Review of Systems: Review of Systems  Constitutional: Negative.   Gastrointestinal: Positive for abdominal pain. Negative for constipation, diarrhea, nausea and vomiting.  Genitourinary: Negative.     Past Medical History:  Patient Active Problem List   Diagnosis Date Noted  . Ectopic pregnancy 12/24/2020  . Intractable migraine without aura and without status migrainosus 10/08/2019    Past Surgical History:  Past Surgical History:  Procedure Laterality Date  . WISDOM TOOTH EXTRACTION      Gynecologic History:  Patient's last menstrual period was 10/22/2020 (approximate).   Obstetric History: G1P0  Family History:  Family History  Problem Relation Age of Onset  . Healthy Mother   . Healthy Father     Social History:  Social History   Socioeconomic  History  . Marital status: Single    Spouse name: Not on file  . Number of children: Not on file  . Years of education: Not on file  . Highest education level: Not on file  Occupational History  . Not on file  Tobacco Use  . Smoking status: Never Smoker  . Smokeless tobacco: Never Used  Vaping Use  . Vaping Use: Never used  Substance and Sexual Activity  . Alcohol use: Yes    Comment: socially  . Drug use: Never  . Sexual activity: Not on file  Other Topics Concern  . Not on file  Social History Narrative  . Not on file   Social Determinants of Health   Financial Resource Strain: Not on file  Food Insecurity: Not on file  Transportation Needs: Not on file  Physical Activity: Not on file  Stress: Not on file  Social Connections: Not on file  Intimate Partner Violence: Not on file    Allergies:  No Known Allergies  Medications: Prior to Admission medications   Medication Sig Start Date End Date Taking? Authorizing Provider  acetaminophen (TYLENOL) 325 MG tablet Take 2 tablets (650 mg total) by mouth every 4 (four) hours as needed for mild pain or moderate pain. 12/26/20   McVey, Prudencio Pair, CNM  ondansetron (ZOFRAN) 4 MG tablet Take 1 tablet (4 mg total) by mouth every 6 (six) hours as needed for nausea. Patient not taking: Reported on 04/17/2021 12/26/20   McVey, Prudencio Pair, CNM  rizatriptan (MAXALT) 10 MG tablet Take by mouth. Patient not taking: Reported on  04/17/2021 12/23/19   [provider]  senna-docusate (SENOKOT-S) 8.6-50 MG tablet Take 2 tablets by mouth daily at 8 pm. Patient not taking: Reported on 04/17/2021 12/26/20   Randa Ngo, CNM    Physical Exam Vitals: Last menstrual period 10/22/2020.  General: NAD, well nourished, appears stated age HEENT: normocephalic, anicteric Pulmonary: No increased work of breathing Neurologic: Grossly intact Psychiatric: mood appropriate, affect full   Assessment: 25 y.o. G1P0 with history of ectopic  pregnancy earlier this year, secondary amenorrhea (potentially started menstrual cycle today)  Problem List Items Addressed This Visit      Genitourinary   Cervical dysplasia     Other   History of ectopic pregnancy   Relevant Orders   DG Hysterogram (HSG) (Completed)    Other Visit Diagnoses    Secondary amenorrhea    -  Primary   Relevant Orders   Follicle stimulating hormone (Completed)   Estradiol (Completed)   Encounter for preconception consultation       Relevant Orders   DG Hysterogram (HSG) (Completed)       1)  The patient does not have identifiable risk factors which would increase her risk of ectopic pregnancy (Approximately 50% of all patient presenting with ectopic pregnancy will have identifiable risk factors such as prior GC/CT, PID, or ART).  The rate of ectopic pregnancy is approximately 2% of all pregnancies.    Demonstration of adnexal mass with a hypoechoic center on ultrasound has a positive predictive value of approximately 80% and may misidentify corpus luteum cysts, paratubal cysts, and or hydrosalpinx among others.  An intrauterine gestational sac makes ectopic pregnancy less likely but may represent pseudosac.  The diagnosis of intrauterine pregnancy requires documentation of an intrauterine gestational sac with accompanying yolk sac or fetal pole.    ACOG Practice Bulletin 191 February 2018 "Ectopic Pregnancy"  - discussed variability in return to normal menses - discussed evaluation of pituitary ovarian axis - HSG to evaluate for evidence of hydrosalpinx or tubal damage - Patient does report positive LH surge 03/16/2021   2) History of cervical dysplasia - repeat pap 08/2021    3)  A total of 25 minutes were spent in face-to-face contact with the patient during this encounter with over half of that time devoted to counseling and coordination of care.  3) Return Will be contacted with HSG date.     Vena Austria, MD, [facility], [facility] 04/17/2021, 11:17 AM

## 2021-04-18 LAB — FOLLICLE STIMULATING HORMONE: FSH: 6.4 m[IU]/mL

## 2021-04-18 LAB — ESTRADIOL: Estradiol: 76.2 pg/mL

## 2021-04-22 ENCOUNTER — Ambulatory Visit
Admission: RE | Admit: 2021-04-22 | Discharge: 2021-04-22 | Disposition: A | Discharge status: Home / Self Care | Payer: BC Managed Care – PPO | Source: Ambulatory Visit | Attending: Obstetrics and Gynecology | Admitting: Obstetrics and Gynecology

## 2021-04-22 ENCOUNTER — Other Ambulatory Visit: Payer: Self-pay

## 2021-04-22 DIAGNOSIS — Z3169 Encounter for other general counseling and advice on procreation: Secondary | ICD-10-CM | POA: Diagnosis present

## 2021-04-22 DIAGNOSIS — Z8759 Personal history of other complications of pregnancy, childbirth and the puerperium: Secondary | ICD-10-CM | POA: Diagnosis not present

## 2021-04-22 MED ORDER — IOHEXOL 300 MG/ML  SOLN
20.0000 mL | Freq: Once | INTRAMUSCULAR | Status: AC | PRN
  Administered 2021-04-22: 9 mL

## 2021-04-22 NOTE — Procedures (Signed)
Consents signed and reviewed with patient.  Indication for procedure confirmed, Patient's last menstrual period was 04/17/2021 (exact date). currently cycle day 5.  No allergies to contrast or iodine was confirmed.  The catheter was primed with [id number] to eliminate air bubbles.  A sterile speculum was placed in the patient's vagina to allow visualization of the cervix.  The cervix was prepped with betadine solution.   The HSG was advanced through the external cervical os into uterine cavity, the catheter balloon was then inflated. Approximately 29mL dye injected under fluoroscopic.  Fallopian tubes were normal caliber bilaterally with good spill of contrast noted.  Catheter removed.  Patient tolerated procedure well.   Vena Austria, MD, [facility], [facility] 04/22/2021, 1:42 PM

## 2021-04-23 DIAGNOSIS — N879 Dysplasia of cervix uteri, unspecified: Secondary | ICD-10-CM | POA: Insufficient documentation

## 2021-05-21 ENCOUNTER — Other Ambulatory Visit: Payer: Self-pay | Admitting: Obstetrics and Gynecology

## 2021-05-21 DIAGNOSIS — O3680X0 Pregnancy with inconclusive fetal viability, not applicable or unspecified: Secondary | ICD-10-CM

## 2021-05-21 DIAGNOSIS — Z8759 Personal history of other complications of pregnancy, childbirth and the puerperium: Secondary | ICD-10-CM

## 2021-05-21 DIAGNOSIS — Z3201 Encounter for pregnancy test, result positive: Secondary | ICD-10-CM

## 2021-05-22 ENCOUNTER — Other Ambulatory Visit: Payer: BC Managed Care – PPO

## 2021-05-22 ENCOUNTER — Other Ambulatory Visit: Payer: Self-pay

## 2021-05-22 DIAGNOSIS — Z8759 Personal history of other complications of pregnancy, childbirth and the puerperium: Secondary | ICD-10-CM

## 2021-05-22 DIAGNOSIS — Z3201 Encounter for pregnancy test, result positive: Secondary | ICD-10-CM

## 2021-05-22 DIAGNOSIS — O3680X0 Pregnancy with inconclusive fetal viability, not applicable or unspecified: Secondary | ICD-10-CM

## 2021-05-22 NOTE — Telephone Encounter (Signed)
Patient is scheduled for 05/22/21 and 6/20 for labs

## 2021-05-23 LAB — BETA HCG QUANT (REF LAB): hCG Quant: 21 m[IU]/mL

## 2021-05-26 ENCOUNTER — Other Ambulatory Visit: Payer: BC Managed Care – PPO

## 2021-05-26 DIAGNOSIS — O3680X0 Pregnancy with inconclusive fetal viability, not applicable or unspecified: Secondary | ICD-10-CM

## 2021-05-26 DIAGNOSIS — Z3201 Encounter for pregnancy test, result positive: Secondary | ICD-10-CM

## 2021-05-26 DIAGNOSIS — Z8759 Personal history of other complications of pregnancy, childbirth and the puerperium: Secondary | ICD-10-CM

## 2021-05-27 LAB — BETA HCG QUANT (REF LAB): hCG Quant: 177 m[IU]/mL

## 2021-05-27 NOTE — Progress Notes (Signed)
Ultrasound with me 6/28-7/1 and NOB

## 2021-06-04 ENCOUNTER — Ambulatory Visit (INDEPENDENT_AMBULATORY_CARE_PROVIDER_SITE_OTHER): Payer: BC Managed Care – PPO | Admitting: Obstetrics and Gynecology

## 2021-06-04 ENCOUNTER — Other Ambulatory Visit: Payer: Self-pay

## 2021-06-04 ENCOUNTER — Encounter: Payer: Self-pay | Admitting: Obstetrics and Gynecology

## 2021-06-04 ENCOUNTER — Other Ambulatory Visit (HOSPITAL_COMMUNITY)
Admission: RE | Admit: 2021-06-04 | Discharge: 2021-06-04 | Disposition: A | Discharge status: Home / Self Care | Payer: BC Managed Care – PPO | Source: Ambulatory Visit | Attending: Obstetrics and Gynecology | Admitting: Obstetrics and Gynecology

## 2021-06-04 VITALS — BP 108/62 | HR 100 | Wt 163.0 lb

## 2021-06-04 DIAGNOSIS — Z113 Encounter for screening for infections with a predominantly sexual mode of transmission: Secondary | ICD-10-CM | POA: Insufficient documentation

## 2021-06-04 DIAGNOSIS — Z369 Encounter for antenatal screening, unspecified: Secondary | ICD-10-CM | POA: Diagnosis present

## 2021-06-04 DIAGNOSIS — Z3400 Encounter for supervision of normal first pregnancy, unspecified trimester: Secondary | ICD-10-CM | POA: Diagnosis not present

## 2021-06-04 DIAGNOSIS — Z3490 Encounter for supervision of normal pregnancy, unspecified, unspecified trimester: Secondary | ICD-10-CM | POA: Insufficient documentation

## 2021-06-04 NOTE — Progress Notes (Signed)
New Obstetric Patient H&P    Chief Complaint: "Desires prenatal care"   History of Present Illness: Patient is a 25 y.o. G2P0 Not Hispanic or Latino female, presents with amenorrhea and positive home pregnancy test. Patient's last menstrual period was 04/17/2021 (exact date). and based on her  LMP, her EDD is Estimated Date of Delivery: 01/22/22 and her EGA is [redacted]w[redacted]d. Cycles have been irregular since ectopic earlier this year tx with MTX,. Her last pap smear was 07/2021 ago and was no abnormalities.    She had a urine pregnancy test which was positive about 2 weeks ago  ago. Her last menstrual period was normal. Since her LMP she claims she has experienced some mild nausea, fatigue, mild LLQ discomfort. She denies vaginal bleeding. Her past medical history is noncontributory. Her prior pregnancies are notable for  ectopic  Since her LMP, she admits to the use of tobacco products  no  There are cats in the home in the home  no  She admits close contact with children on a regular basis  no  She has had chicken pox in the past yes She has had Tuberculosis exposures, symptoms, or previously tested positive for TB   no Current or past history of domestic violence. no  Genetic Screening/Teratology Counseling: (Includes patient, baby's father, or anyone in either family with:)   1. Patient's age >/= 65 at [facility]  no 2. Thalassemia ([place], [place], Mediterranean, or Asian background): MCV<80  no 3. Neural tube defect (meningomyelocele, spina bifida, anencephaly)  no 4. Congenital heart defect  no  5. Down syndrome  no 6. Tay-Sachs (Jewish, [place]  no 7. Canavan's Disease  no 8. Sickle cell disease or trait (African)  no  9. Hemophilia or other blood disorders  no  10. Muscular dystrophy  no  11. Cystic fibrosis  no  12. Huntington's Chorea  no  13. Mental retardation/autism  no 14. Other inherited genetic or chromosomal disorder  no 15. Maternal metabolic disorder (DM, PKU, etc)   no 16. Patient or FOB with a child with a birth defect not listed above no  16a. Patient or FOB with a birth defect themselves no 17. Recurrent pregnancy loss, or stillbirth  no  18. Any medications since LMP other than prenatal vitamins (include vitamins, supplements, OTC meds, drugs, alcohol)  no 19. Any other genetic/environmental exposure to discuss  no  Infection History:   1. Lives with someone with TB or TB exposed  no  2. Patient or partner has history of genital herpes  no 3. Rash or viral illness since LMP  no 4. History of STI (GC, CT, HPV, syphilis, HIV)  no 5. History of recent travel :  no  Other pertinent information:  no     Review of Systems:10 point review of systems negative unless otherwise noted in HPI  Past Medical History:  Patient Active Problem List   Diagnosis Date Noted   Cervical dysplasia 04/23/2021    08/19/2020 Colposcopy negative ectocervix and ECC 07/31/2020 NILM HPV positive 08/10/2019 Colposcopy CIN I and positive HPV 07/27/2019 ASC-H HPV positive 07/27/2016 NILM    History of ectopic pregnancy    Ectopic pregnancy 12/24/2020   Intractable migraine without aura and without status migrainosus 10/08/2019    Past Surgical History:  Past Surgical History:  Procedure Laterality Date   WISDOM TOOTH EXTRACTION      Gynecologic History: Patient's last menstrual period was 04/17/2021 (exact date).  Obstetric History: G2P0  Family History:  Family History  Problem Relation Age of Onset   Healthy Mother    Healthy Father     Social History:  Social History   Socioeconomic History   Marital status: Married    Spouse name: Not on file   Number of children: Not on file   Years of education: Not on file   Highest education level: Not on file  Occupational History   Not on file  Tobacco Use   Smoking status: Never   Smokeless tobacco: Never  Vaping Use   Vaping Use: Never used  Substance and Sexual Activity   Alcohol use: Yes     Comment: socially   Drug use: Never   Sexual activity: Not on file  Other Topics Concern   Not on file  Social History Narrative   Not on file   Social Determinants of Health   Financial Resource Strain: Not on file  Food Insecurity: Not on file  Transportation Needs: Not on file  Physical Activity: Not on file  Stress: Not on file  Social Connections: Not on file  Intimate Partner Violence: Not on file    Allergies:  No Known Allergies  Medications: Prior to Admission medications   Medication Sig Start Date End Date Taking? Authorizing Provider  acetaminophen (TYLENOL) 325 MG tablet Take 2 tablets (650 mg total) by mouth every 4 (four) hours as needed for mild pain or moderate pain. 12/26/20   McVey, Prudencio Pair, CNM  ondansetron (ZOFRAN) 4 MG tablet Take 1 tablet (4 mg total) by mouth every 6 (six) hours as needed for nausea. Patient not taking: Reported on 04/17/2021 12/26/20   McVey, Prudencio Pair, CNM  rizatriptan (MAXALT) 10 MG tablet Take by mouth. Patient not taking: Reported on 04/17/2021 12/23/19   [provider]  senna-docusate (SENOKOT-S) 8.6-50 MG tablet Take 2 tablets by mouth daily at 8 pm. Patient not taking: Reported on 04/17/2021 12/26/20   McVey, Prudencio Pair, CNM    Physical Exam Vitals: Blood pressure 108/62, pulse 100, weight 163 lb (73.9 kg), last menstrual period 04/17/2021, unknown if currently breastfeeding.  General: NAD HEENT: normocephalic, anicteric Pulmonary: No increased work of breathing, CTAB Genitourinary:  External: Normal external female genitalia.  Normal urethral meatus, normal  Bartholin's and Skene's glands.    Vagina: Normal vaginal mucosa, no evidence of prolapse.    Cervix: Grossly normal in appearance, no bleeding  Uterus:  Non-enlarged, mobile, normal contour.  No CMT  Adnexa: ovaries non-enlarged, no adnexal masses  Rectal: deferred Extremities: no edema, erythema, or tenderness Neurologic: Grossly intact Psychiatric: mood  appropriate, affect full  TVUS singleton IUP, GS and Yolk sac with early fetal pole [redacted]w[redacted]d no fetal cardiac activity.  Right corpus luteum cyst 3x3cm.  Normal left ovary.  No free fluid, no fibroids  Assessment: 25 y.o. G2P0 at [redacted]w[redacted]d presenting to initiate prenatal care  Plan: 1) Avoid alcoholic beverages. 2) Patient encouraged not to smoke.  3) Discontinue the use of all non-medicinal drugs and chemicals.  4) Take prenatal vitamins daily.  5) Nutrition, food safety (fish, cheese advisories, and high nitrite foods) and exercise discussed. 6) Hospital and practice style discussed with cross coverage system.  7) Genetic Screening, such as with 1st Trimester Screening, cell free fetal DNA, AFP testing, and Ultrasound, as well as with amniocentesis and CVS as appropriate, is discussed with patient. At the conclusion of today's visit patient requested genetic testing 8) Dating scan in 82 days  Vena [place], MD, [facility], [facility] 06/04/2021, 9:20 AM

## 2021-06-04 NOTE — Progress Notes (Signed)
NOB - dating ultrasound, no concerns. [place] RM 1

## 2021-06-05 LAB — RPR+RH+ABO+RUB AB+AB SCR+CB...
Antibody Screen: NEGATIVE
HIV Screen 4th Generation wRfx: NONREACTIVE
Hematocrit: 39.9 % (ref 34.0–46.6)
Hemoglobin: 13.6 g/dL (ref 11.1–15.9)
Hepatitis B Surface Ag: NEGATIVE
MCH: 30.6 pg (ref 26.6–33.0)
MCHC: 34.1 g/dL (ref 31.5–35.7)
MCV: 90 fL (ref 79–97)
Platelets: 315 10*3/uL (ref 150–450)
RBC: 4.44 x10E6/uL (ref 3.77–5.28)
RDW: 12.2 % (ref 11.7–15.4)
RPR Ser Ql: NONREACTIVE
Rh Factor: POSITIVE
Rubella Antibodies, IGG: 5.97 index (ref >0.99)
Varicella zoster IgG: 875 index (ref >165)
WBC: 12.3 10*3/uL — ABNORMAL HIGH (ref 3.4–10.8)

## 2021-06-05 LAB — CERVICOVAGINAL ANCILLARY ONLY
Chlamydia: NEGATIVE
Comment: NEGATIVE
Comment: NORMAL
Neisseria Gonorrhea: NEGATIVE

## 2021-06-06 LAB — URINE CULTURE: Organism ID, Bacteria: NO GROWTH

## 2021-06-16 ENCOUNTER — Encounter: Payer: BC Managed Care – PPO | Admitting: Obstetrics and Gynecology

## 2021-06-19 ENCOUNTER — Ambulatory Visit (INDEPENDENT_AMBULATORY_CARE_PROVIDER_SITE_OTHER): Payer: BC Managed Care – PPO | Admitting: Obstetrics and Gynecology

## 2021-06-19 ENCOUNTER — Encounter: Payer: Self-pay | Admitting: Obstetrics and Gynecology

## 2021-06-19 ENCOUNTER — Other Ambulatory Visit: Payer: Self-pay

## 2021-06-19 VITALS — BP 133/83 | Wt 156.0 lb

## 2021-06-19 DIAGNOSIS — O09291 Supervision of pregnancy with other poor reproductive or obstetric history, first trimester: Secondary | ICD-10-CM

## 2021-06-19 DIAGNOSIS — Z3A01 Less than 8 weeks gestation of pregnancy: Secondary | ICD-10-CM

## 2021-06-19 DIAGNOSIS — Z3400 Encounter for supervision of normal first pregnancy, unspecified trimester: Secondary | ICD-10-CM | POA: Diagnosis not present

## 2021-06-19 DIAGNOSIS — Z8759 Personal history of other complications of pregnancy, childbirth and the puerperium: Secondary | ICD-10-CM

## 2021-06-19 DIAGNOSIS — O282 Abnormal cytological finding on antenatal screening of mother: Secondary | ICD-10-CM

## 2021-06-19 NOTE — Progress Notes (Signed)
Routine Prenatal Care Visit  Subjective  Kathryn Wong is a 25 y.o. G2P0 at [redacted]w[redacted]d being seen today for ongoing prenatal care.  She is currently monitored for the following issues for this low-risk pregnancy and has Intractable migraine without aura and without status migrainosus; Ectopic pregnancy; History of ectopic pregnancy; Cervical dysplasia; and Supervision of normal first pregnancy, antepartum on their problem list.  ----------------------------------------------------------------------------------- Patient reports no complaints.    . Vag. Bleeding: None.   . Leaking Fluid denies.  ----------------------------------------------------------------------------------- The following portions of the patient's history were reviewed and updated as appropriate: allergies, current medications, past family history, past medical history, past social history, past surgical history and problem list. Problem list updated.  Objective  Blood pressure 133/83, weight 156 lb (70.8 kg), last menstrual period 04/17/2021, unknown if currently breastfeeding. Pregravid weight 155 lb (70.3 kg) Total Weight Gain 1 lb (0.454 kg) Urinalysis: Urine Protein    Urine Glucose    Fetal Status: Fetal Heart Rate (bpm): 147         General:  Alert, oriented and cooperative. Patient is in no acute distress.  Skin: Skin is warm and dry. No rash noted.   Cardiovascular: Normal heart rate noted  Respiratory: Normal respiratory effort, no problems with respiration noted  Abdomen: Soft, gravid, appropriate for gestational age. Pain/Pressure: Present     Pelvic:  Cervical exam deferred        Extremities: Normal range of motion.     Mental Status: Normal mood and affect. Normal behavior. Normal judgment and thought content.   Assessment   25 y.o. G2P0 at [redacted]w[redacted]d by  02/02/2022, by Ultrasound presenting for routine prenatal visit  Plan   pregnancy 2 Problems (from 06/04/21 to present)     Problem Noted Resolved    Supervision of normal first pregnancy, antepartum 06/04/2021 by Vena [place], MD No   Overview Addendum 06/19/2021 12:03 PM by Conard Novak, MD     Nursing Staff Provider  Office Location  [facility] Dating  7 wk u/s  Language  English Anatomy US    Flu Vaccine   Genetic Screen  NIPS:   TDaP vaccine    Hgb A1C or  GTT Early : n/a Third trimester :   Covid    LAB RESULTS   Rhogam   Blood Type O/Positive/-- (06/29 0916)   Feeding Plan  Antibody Negative (06/29 0916)  Contraception  Rubella 5.97 (06/29 0916)  Circumcision  RPR Non Reactive (06/29 0916)   Pediatrician   HBsAg Negative (06/29 0916)   Support Person  HIV Non Reactive (06/29 0916)  Prenatal Classes  Varicella Immune    GBS  (For PCN allergy, check sensitivities)   BTL Consent     VBAC Consent  Pap      Hgb Electro    Pelvis Tested  CF      SMA               History of ectopic pregnancy  by Vena [place], MD No         Preterm labor symptoms and general obstetric precautions including but not limited to vaginal bleeding, contractions, leaking of fluid and fetal movement were reviewed in detail with the patient. Please refer to After Visit Summary for other counseling recommendations.   See u/s report from today for details of dating ultrasound.  Return in about 3 weeks (around 07/10/2021) for Routine Prenatal Appointment (maybe 1 week for ROB, if available).   Thomasene Mohair, MD, [facility],  [facility] 06/19/2021 12:02 PM

## 2021-06-19 NOTE — Progress Notes (Signed)
ULTRASOUND REPORT  Location: [facility] Date of Service: 06/19/2021   Indications:dating, history of ectopic pregnancy (Transvaginal) Findings:  Singleton intrauterine pregnancy is visualized with a CRL consistent with 103w3d gestation, giving an (U/S) EDD of 02/02/2022. The (U/S) EDD is not consistent with the clinically established EDD of 01/22/2022.  FHR: 167 BPM CRL measurement: 12.0 mm Yolk sac is visualized and appears normal. Amnion: visualized and appears normal   Right Ovary is not normal in appearance.  There is a simple-appearing cystic structure within the ovary measuring 3.86 x 3.5 x 3.73 cm. There is an adjacent cystic-appearing structure, which could represent an additional structure or a para-ovarian cyst.   Left Ovary is normal appearance. Corpus luteal cyst:  is not visualized Survey of the adnexa demonstrates no adnexal masses. There is no free peritoneal fluid in the cul de sac.  Female chaperone present for pelvic ultrasound:   Impression: 1. [redacted]w[redacted]d Viable Singleton Intrauterine pregnancy by U/S. 2. (U/S) EDD is consistent with Clinically established EDD of 01/02/2022.  Ultrasound today changes EDD.   There is a viable singleton gestation.  Detailed evaluation of the fetal anatomy is precluded by early gestational age.  It must be noted that a normal ultrasound particular at this early gestational age is unable to rule out fetal aneuploidy, risk of first trimester miscarriage, or anatomic birth defects.  I personally performed the ultrasound and interpreted the images.   Thomasene Mohair, MD, [facility], [facility] 06/19/2021 12:08 PM

## 2021-06-19 NOTE — Patient Instructions (Signed)
For nausea (these may be purchased over-the-counter): -Vitamin B6 (pyridoxine):  25 mg three times each day (may buy 100 mg tablet and take twice per day or try to cut into 4 equal pieces and take 1 piece three times each day).  - doxylamine (found in [employer] and other sleep agents that can be bought in the store): take 25 - 50 mg at bedtime.  May take up to 25 mg three time each day.  However, keep in mind that this might make you sleepy.  

## 2021-06-23 ENCOUNTER — Other Ambulatory Visit: Payer: Self-pay | Admitting: Obstetrics and Gynecology

## 2021-06-23 MED ORDER — ONDANSETRON 4 MG PO TBDP: 4.0000 mg | ORAL_TABLET | Freq: Four times a day (QID) | ORAL | 2 refills | Status: DC | PRN

## 2021-07-07 ENCOUNTER — Other Ambulatory Visit: Payer: Self-pay

## 2021-07-07 ENCOUNTER — Ambulatory Visit (INDEPENDENT_AMBULATORY_CARE_PROVIDER_SITE_OTHER): Payer: BC Managed Care – PPO | Admitting: Obstetrics and Gynecology

## 2021-07-07 ENCOUNTER — Encounter: Payer: Self-pay | Admitting: Obstetrics and Gynecology

## 2021-07-07 VITALS — BP 132/76 | Ht 63.0 in | Wt 154.2 lb

## 2021-07-07 DIAGNOSIS — Z3A10 10 weeks gestation of pregnancy: Secondary | ICD-10-CM

## 2021-07-07 DIAGNOSIS — Z3401 Encounter for supervision of normal first pregnancy, first trimester: Secondary | ICD-10-CM

## 2021-07-07 LAB — POCT URINALYSIS DIPSTICK OB
Glucose, UA: NEGATIVE
POC,PROTEIN,UA: NEGATIVE

## 2021-07-07 NOTE — Progress Notes (Signed)
Routine Prenatal Care Visit  Subjective  Kathryn Wong is a 25 y.o. G2P0 at [redacted]w[redacted]d being seen today for ongoing prenatal care.  She is currently monitored for the following issues for this low-risk pregnancy and has Intractable migraine without aura and without status migrainosus; Ectopic pregnancy; History of ectopic pregnancy; Cervical dysplasia; and Supervision of normal first pregnancy, antepartum on their problem list.  ----------------------------------------------------------------------------------- Patient reports mild nausea, controlled with zofran.    . Vag. Bleeding: None.   . Leaking Fluid denies.  ----------------------------------------------------------------------------------- The following portions of the patient's history were reviewed and updated as appropriate: allergies, current medications, past family history, past medical history, past social history, past surgical history and problem list. Problem list updated.  Objective  Blood pressure 132/76, height 5\' 3"  (1.6 m), weight 154 lb 3.2 oz (69.9 kg), last menstrual period 04/17/2021, unknown if currently breastfeeding. Pregravid weight 155 lb (70.3 kg) Total Weight Gain -12.8 oz (-0.363 kg) Urinalysis: Urine Protein Negative  Urine Glucose Negative  Fetal Status: Fetal Heart Rate (bpm): 173         General:  Alert, oriented and cooperative. Patient is in no acute distress.  Skin: Skin is warm and dry. No rash noted.   Cardiovascular: Normal heart rate noted  Respiratory: Normal respiratory effort, no problems with respiration noted  Abdomen: Soft, gravid, appropriate for gestational age. Pain/Pressure: Absent     Pelvic:  Cervical exam deferred        Extremities: Normal range of motion.     Mental Status: Normal mood and affect. Normal behavior. Normal judgment and thought content.   Assessment   25 y.o. G2P0 at [redacted]w[redacted]d by  02/02/2022, by Ultrasound presenting for routine prenatal visit  Plan   pregnancy 2  Problems (from 06/04/21 to present)     Problem Noted Resolved   Supervision of normal first pregnancy, antepartum 06/04/2021 by 06/06/2021, MD No   Overview Addendum 07/07/2021  2:40 PM by 09/06/2021, MD     Nursing Staff Provider  Office Location  [facility] Dating  7 wk u/s  Language  English Anatomy Conard Novak    Flu Vaccine   Genetic Screen  NIPS: unsure  TDaP vaccine    Hgb A1C or  GTT Early : n/a Third trimester :   Covid    LAB RESULTS   Rhogam   Blood Type O/Positive/-- (06/29 0916)   Feeding Plan  Antibody Negative (06/29 0916)  Contraception  Rubella 5.97 (06/29 0916)  Circumcision  RPR Non Reactive (06/29 0916)   Pediatrician   HBsAg Negative (06/29 0916)   Support Person  HIV Non Reactive (06/29 0916)  Prenatal Classes  Varicella Immune    GBS  (For PCN allergy, check sensitivities)   BTL Consent     VBAC Consent  Pap      Hgb Electro    Pelvis Tested  CF      SMA               History of ectopic pregnancy  by 03-09-1985, MD No        Preterm labor symptoms and general obstetric precautions including but not limited to vaginal bleeding, contractions, leaking of fluid and fetal movement were reviewed in detail with the patient. Please refer to After Visit Summary for other counseling recommendations.   - unsure about genetic screening. Declines today.   Return in about 4 weeks (around 08/04/2021) for Routine Prenatal Appointment.   08/06/2021, MD, [facility], [facility] 07/07/2021 2:40 PM

## 2021-07-14 NOTE — Telephone Encounter (Signed)
Patient requesting a call back. States she is having severe constipation and she started bleeding (from her butt). She has been bleeding through out the day and it started this morning.

## 2021-08-04 ENCOUNTER — Other Ambulatory Visit: Payer: Self-pay

## 2021-08-04 ENCOUNTER — Ambulatory Visit (INDEPENDENT_AMBULATORY_CARE_PROVIDER_SITE_OTHER): Payer: BC Managed Care – PPO | Admitting: Obstetrics and Gynecology

## 2021-08-04 VITALS — BP 128/82 | Wt 150.0 lb

## 2021-08-04 DIAGNOSIS — Z3400 Encounter for supervision of normal first pregnancy, unspecified trimester: Secondary | ICD-10-CM

## 2021-08-04 DIAGNOSIS — Z3A14 14 weeks gestation of pregnancy: Secondary | ICD-10-CM

## 2021-08-04 LAB — POCT URINALYSIS DIPSTICK OB
Glucose, UA: NEGATIVE
POC,PROTEIN,UA: NEGATIVE

## 2021-08-04 MED ORDER — PROCHLORPERAZINE MALEATE 10 MG PO TABS: 10.0000 mg | ORAL_TABLET | Freq: Four times a day (QID) | ORAL | 0 refills | Status: DC | PRN

## 2021-08-04 NOTE — Progress Notes (Signed)
ROB - no concerns. RM 5 

## 2021-08-04 NOTE — Progress Notes (Signed)
    Routine Prenatal Care Visit  Subjective  Kathryn Wong is a 25 y.o. G2P0 at [redacted]w[redacted]d being seen today for ongoing prenatal care.  She is currently monitored for the following issues for this low-risk pregnancy and has Intractable migraine without aura and without status migrainosus; Ectopic pregnancy; History of ectopic pregnancy; Cervical dysplasia; and Supervision of normal first pregnancy, antepartum on their problem list.  ----------------------------------------------------------------------------------- Patient reports headache and vomiting.   Contractions: Not present. Vag. Bleeding: None.  Movement: Absent. Denies leaking of fluid.  ----------------------------------------------------------------------------------- The following portions of the patient's history were reviewed and updated as appropriate: allergies, current medications, past family history, past medical history, past social history, past surgical history and problem list. Problem list updated.   Objective  Blood pressure 128/82, weight 150 lb (68 kg), last menstrual period 04/17/2021, unknown if currently breastfeeding. Pregravid weight 155 lb (70.3 kg) Total Weight Gain -5 lb (-2.268 kg) Urinalysis:      Fetal Status: Fetal Heart Rate (bpm): 160   Movement: Absent     General:  Alert, oriented and cooperative. Patient is in no acute distress.  Skin: Skin is warm and dry. No rash noted.   Cardiovascular: Normal heart rate noted  Respiratory: Normal respiratory effort, no problems with respiration noted  Abdomen: Soft, gravid, appropriate for gestational age. Pain/Pressure: Absent     Pelvic:  Cervical exam deferred        Extremities: Normal range of motion.     ental Status: Normal mood and affect. Normal behavior. Normal judgment and thought content.     Assessment   25 y.o. G2P0 at [redacted]w[redacted]d by  02/02/2022, by Ultrasound presenting for routine prenatal visit  Plan   pregnancy 2 Problems (from 06/04/21 to  present)     Problem Noted Resolved   Supervision of normal first pregnancy, antepartum 06/04/2021 by Vena [place], MD No   Overview Addendum 07/07/2021  2:40 PM by Conard Novak, MD     Nursing Staff Provider  Office Location  [facility] Dating  7 wk u/s  Language  English Anatomy US    Flu Vaccine   Genetic Screen  NIPS: declined  TDaP vaccine    Hgb A1C or  GTT Early : n/a Third trimester :   Covid    LAB RESULTS   Rhogam   Blood Type O/Positive/-- (06/29 0916)   Feeding Plan  Antibody Negative (06/29 0916)  Contraception  Rubella 5.97 (06/29 0916)  Circumcision  RPR Non Reactive (06/29 0916)   Pediatrician   HBsAg Negative (06/29 0916)   Support Person  HIV Non Reactive (06/29 0916)  Prenatal Classes  Varicella Immune    GBS  (For PCN allergy, check sensitivities)   BTL Consent     VBAC Consent  Pap      Hgb Electro    Pelvis Tested  CF      SMA               History of ectopic pregnancy  by Vena [place], MD No        Gestational age appropriate obstetric precautions including but not limited to vaginal bleeding, contractions, leaking of fluid and fetal movement were reviewed in detail with the patient.    - Rx compazine for headaches - anatomy scan already scheduled  Return in about 4 weeks (around 09/01/2021) for ROB.  Vena [place], MD, [facility], [facility] 08/04/2021, 3:43 PM

## 2021-08-20 NOTE — Telephone Encounter (Signed)
Patient is calling back about the pain that she is having. Patient is wanting to be seen. She doesn't want to go to the hospital if she don't have to. Could you help advise if patient could be seen this pm by [facility]

## 2021-08-25 ENCOUNTER — Other Ambulatory Visit: Payer: Self-pay

## 2021-08-25 ENCOUNTER — Ambulatory Visit (INDEPENDENT_AMBULATORY_CARE_PROVIDER_SITE_OTHER): Payer: BC Managed Care – PPO | Admitting: Advanced Practice Midwife

## 2021-08-25 VITALS — BP 110/70 | Wt 154.0 lb

## 2021-08-25 DIAGNOSIS — Z3482 Encounter for supervision of other normal pregnancy, second trimester: Secondary | ICD-10-CM

## 2021-08-25 DIAGNOSIS — Z3A17 17 weeks gestation of pregnancy: Secondary | ICD-10-CM

## 2021-08-25 NOTE — Patient Instructions (Signed)
Round Ligament Pain The round ligaments are a pair of cord-like tissues that help support the uterus. They can become a source of pain during pregnancy as the ligaments soften and stretch as the baby grows. The pain usually begins in the second trimester (13-28 weeks) of pregnancy, and should only last for a few seconds when it occurs. However, the pain can come and go until the baby is delivered. The pain does not cause harm to the baby. Round ligament pain is usually a short, sharp, and pinching pain, but it can also be a dull, lingering, and aching pain. The pain is felt in the lower side of the abdomen or in the groin. It usually starts deep in the groin and moves up to the outside of the hip area. The pain may happen when you: Suddenly change position, such as quickly going from a sitting to standing position. Do physical activity. Cough or sneeze. Follow these instructions at home: Managing pain  When the pain starts, relax. Then, try any of these methods to help with the pain: Sit down. Flex your knees up to your abdomen. Lie on your side with one pillow under your abdomen and another pillow between your legs. Sit in a warm bath for 15-20 minutes or until the pain goes away. General instructions Watch your condition for any changes. Move slowly when you sit down or stand up. Stop or reduce your physical activities if they cause pain. Avoid long walks if they cause pain. Take over-the-counter and prescription medicines only as told by your health care provider. Keep all follow-up visits. This is important. Contact a health care provider if: Your pain does not go away with treatment. You feel pain in your back that you did not have before. Your medicine is not helping. You have a fever or chills. You have nausea or vomiting. You have diarrhea. You have pain when you urinate. Get help right away if: You have pain that is a rhythmic, cramping pain similar to labor pains. Labor pains  are usually 2 minutes apart, last for about 1 minute, and involve a bearing down feeling or pressure in your pelvis. You have vaginal bleeding. These symptoms may represent a serious problem that is an emergency. Do not wait to see if the symptoms will go away. Get medical help right away. Call your local emergency services (911 in the [place] Do not drive yourself to the hospital. Summary Round ligament pain is felt in the lower abdomen or groin. This pain usually begins in the second trimester (13-28 weeks) and should only last for a few seconds when it occurs. You may notice the pain when you suddenly change position, when you cough or sneeze, or during physical activity. Relaxing, flexing your knees to your abdomen, lying on one side, or taking a warm bath may help to get rid of the pain. Contact your health care provider if the pain does not go away. This information is not intended to replace advice given to you by your health care provider. Make sure you discuss any questions you have with your health care provider. Document Revised: 02/05/2021 Document Reviewed: 02/05/2021 Elsevier Patient Education  2022 [employer]  

## 2021-08-25 NOTE — Progress Notes (Signed)
  Routine Prenatal Care Visit  Subjective  Kathryn Wong is a 25 y.o. G2P0 at [redacted]w[redacted]d being seen today for ongoing prenatal care.  She is currently monitored for the following issues for this low-risk pregnancy and has Intractable migraine without aura and without status migrainosus; History of ectopic pregnancy; Cervical dysplasia; and Supervision of normal pregnancy on their problem list.  ----------------------------------------------------------------------------------- Patient reports lower left pelvic pain that reminds her of ectopic pregnancy pain- we discussed round ligament pain and comfort measures.   Contractions: Not present. Vag. Bleeding: None.  Movement: Absent. Leaking Fluid denies.  ----------------------------------------------------------------------------------- The following portions of the patient's history were reviewed and updated as appropriate: allergies, current medications, past family history, past medical history, past social history, past surgical history and problem list. Problem list updated.  Objective  Blood pressure 110/70, weight 154 lb (69.9 kg), last menstrual period 04/17/2021, unknown if currently breastfeeding. Pregravid weight 155 lb (70.3 kg) Total Weight Gain -1 lb (-0.454 kg) Urinalysis: Urine Protein    Urine Glucose    Fetal Status: Fetal Heart Rate (bpm): 145   Movement: Absent     General:  Alert, oriented and cooperative. Patient is in no acute distress.  Skin: Skin is warm and dry. No rash noted.   Cardiovascular: Normal heart rate noted  Respiratory: Normal respiratory effort, no problems with respiration noted  Abdomen: Soft, gravid, appropriate for gestational age. Pain/Pressure: Present     Pelvic:  Cervical exam deferred        Extremities: Normal range of motion.     Mental Status: Normal mood and affect. Normal behavior. Normal judgment and thought content.   Assessment   25 y.o. G2P0 at [redacted]w[redacted]d by  02/02/2022, by Ultrasound  presenting for work-in prenatal visit  Plan   pregnancy 2 Problems (from 06/04/21 to present)    Problem Noted Resolved   Supervision of normal first pregnancy, antepartum 06/04/2021 by Vena Austria, MD No   Overview Addendum 07/07/2021  2:40 PM by Conard Novak, MD     Nursing Staff Provider  Office Location  [facility] Dating  7 wk u/s  Language  English Anatomy US    Flu Vaccine   Genetic Screen  NIPS: unsure  TDaP vaccine    Hgb A1C or  GTT Early : n/a Third trimester :   Covid    LAB RESULTS   Rhogam   Blood Type O/Positive/-- (06/29 0916)   Feeding Plan  Antibody Negative (06/29 0916)  Contraception  Rubella 5.97 (06/29 0916)  Circumcision  RPR Non Reactive (06/29 0916)   Pediatrician   HBsAg Negative (06/29 0916)   Support Person  HIV Non Reactive (06/29 0916)  Prenatal Classes  Varicella Immune    GBS  (For PCN allergy, check sensitivities)   BTL Consent     VBAC Consent  Pap      Hgb Electro    Pelvis Tested  CF      SMA                History of ectopic pregnancy  by Vena Austria, MD No       Preterm labor symptoms and general obstetric precautions including but not limited to vaginal bleeding, contractions, leaking of fluid and fetal movement were reviewed in detail with the patient. Please refer to After Visit Summary for other counseling recommendations.   Return for scheduled follow up.  Tresea Mall, CNM 08/25/2021 11:02 AM

## 2021-09-08 ENCOUNTER — Other Ambulatory Visit: Payer: Self-pay

## 2021-09-08 ENCOUNTER — Ambulatory Visit
Admission: RE | Admit: 2021-09-08 | Discharge: 2021-09-08 | Disposition: A | Discharge status: Home / Self Care | Payer: BC Managed Care – PPO | Source: Ambulatory Visit | Attending: Obstetrics and Gynecology | Admitting: Obstetrics and Gynecology

## 2021-09-08 DIAGNOSIS — Z3401 Encounter for supervision of normal first pregnancy, first trimester: Secondary | ICD-10-CM | POA: Insufficient documentation

## 2021-09-08 DIAGNOSIS — Z3A10 10 weeks gestation of pregnancy: Secondary | ICD-10-CM | POA: Insufficient documentation

## 2021-09-09 ENCOUNTER — Ambulatory Visit (INDEPENDENT_AMBULATORY_CARE_PROVIDER_SITE_OTHER): Payer: BC Managed Care – PPO | Admitting: Obstetrics and Gynecology

## 2021-09-09 ENCOUNTER — Encounter: Payer: Self-pay | Admitting: Obstetrics and Gynecology

## 2021-09-09 VITALS — BP 112/70 | Ht 63.0 in | Wt 156.2 lb

## 2021-09-09 DIAGNOSIS — Z3A19 19 weeks gestation of pregnancy: Secondary | ICD-10-CM

## 2021-09-09 DIAGNOSIS — Z3482 Encounter for supervision of other normal pregnancy, second trimester: Secondary | ICD-10-CM

## 2021-09-09 LAB — POCT URINALYSIS DIPSTICK OB
Glucose, UA: NEGATIVE
POC,PROTEIN,UA: NEGATIVE

## 2021-09-09 NOTE — Progress Notes (Signed)
Routine Prenatal Care Visit  Subjective  Kathryn Wong is a 25 y.o. G2P0 at [redacted]w[redacted]d being seen today for ongoing prenatal care.  She is currently monitored for the following issues for this low-risk pregnancy and has Intractable migraine without aura and without status migrainosus; History of ectopic pregnancy; Cervical dysplasia; and Supervision of normal pregnancy on their problem list.  ----------------------------------------------------------------------------------- Patient reports  nausea has somewhat improved with zofran. She has been having headaches, history of migraines .  Some back pain in pregnancy. Contractions: Not present. Vag. Bleeding: None.  Movement: Absent. Denies leaking of fluid.  ----------------------------------------------------------------------------------- The following portions of the patient's history were reviewed and updated as appropriate: allergies, current medications, past family history, past medical history, past social history, past surgical history and problem list. Problem list updated.   Objective  Blood pressure 112/70, height 5\' 3"  (1.6 m), weight 156 lb 3.2 oz (70.9 kg), last menstrual period 04/17/2021, unknown if currently breastfeeding. Pregravid weight 155 lb (70.3 kg) Total Weight Gain 1 lb 3.2 oz (0.544 kg) Urinalysis:      Fetal Status: Fetal Heart Rate (bpm): 140   Movement: Absent     General:  Alert, oriented and cooperative. Patient is in no acute distress.  Skin: Skin is warm and dry. No rash noted.   Cardiovascular: Normal heart rate noted  Respiratory: Normal respiratory effort, no problems with respiration noted  Abdomen: Soft, gravid, appropriate for gestational age. Pain/Pressure: Absent     Pelvic:  Cervical exam deferred        Extremities: Normal range of motion.  Edema: None  Mental Status: Normal mood and affect. Normal behavior. Normal judgment and thought content.     Assessment   25 y.o. G2P0 at [redacted]w[redacted]d by   02/02/2022, by Ultrasound presenting for routine prenatal visit  Plan    pregnancy 2 Problems (from 06/04/21 to present)     Problem Noted Resolved   Supervision of normal pregnancy 06/04/2021 by 06/06/2021, MD No   Overview Addendum 09/09/2021  4:08 PM by 11/09/2021, MD     Nursing Staff Provider  Office Location  [facility] Dating  7 wk u/s  Language  English Anatomy Natale Milch  Normal, complete  Flu Vaccine  Declines Genetic Screen  NIPS: declines  TDaP vaccine    Hgb A1C or  GTT Early : n/a Third trimester :   Covid Unvaccinated Covid-2020   LAB RESULTS   Rhogam  Not needed Blood Type O/Positive/-- (06/29 0916)   Feeding Plan Breast Antibody Negative (06/29 0916)  Contraception  Rubella 5.97 (06/29 0916)  Circumcision  RPR Non Reactive (06/29 0916)   Pediatrician   HBsAg Negative (06/29 0916)   Support Person Conner- HIV Non Reactive (06/29 0916)  Prenatal Classes Discussed Varicella Immune    GBS  (For PCN allergy, check sensitivities)   BTL Consent     VBAC Consent  Pap 2021- NIL HPV +, repeat postpartum    Hgb Electro    Pelvis Tested  CF      SMA               History of ectopic pregnancy  by 2022, MD No       Gestational age appropriate obstetric precautions including but not limited to vaginal bleeding, contractions, leaking of fluid and fetal movement were reviewed in detail with the patient.    Return in about 4 weeks (around 10/07/2021) for ROB in person.  13/12/2020 MD [facility],  [facility] 09/09/2021, 4:05 PM

## 2021-09-09 NOTE — Patient Instructions (Addendum)
Magnesium 500 mg daily B2 400 mg daily Coenzyme q10 150 mg daily Ferrous Sulfate 65 mg elemental iron daily   Second Trimester of Pregnancy The second trimester of pregnancy is from week 13 through week 27. This is months 4 through 6 of pregnancy. The second trimester is often a time when you feel your best. Your body has adjusted to being pregnant, and you begin to feel better physically. During the second trimester: Morning sickness has lessened or stopped completely. You may have more energy. You may have an increase in appetite. The second trimester is also a time when the unborn baby (fetus) is growing rapidly. At the end of the sixth month, the fetus may be up to 12 inches long and weigh about 1 pounds. You will likely begin to feel the baby move (quickening) between 16 and 20 weeks of pregnancy. Body changes during your second trimester Your body continues to go through many changes during your second trimester. The changes vary and generally return to normal after the baby is born. Physical changes Your weight will continue to increase. You will notice your lower abdomen bulging out. You may begin to get stretch marks on your hips, abdomen, and breasts. Your breasts will continue to grow and to become tender. Dark spots or blotches (chloasma or mask of pregnancy) may develop on your face. A dark line from your belly button to the pubic area (linea nigra) may appear. You may have changes in your hair. These can include thickening of your hair, rapid growth, and changes in texture. Some people also have hair loss during or after pregnancy, or hair that feels dry or thin. Health changes You may develop headaches. You may have heartburn. You may develop constipation. You may develop hemorrhoids or swollen, bulging veins (varicose veins). Your gums may bleed and may be sensitive to brushing and flossing. You may urinate more often because the fetus is pressing on your bladder. You  may have back pain. This is caused by: Weight gain. Pregnancy hormones that are relaxing the joints in your pelvis. A shift in weight and the muscles that support your balance. Follow these instructions at home: Medicines Follow your health care provider's instructions regarding medicine use. Specific medicines may be either safe or unsafe to take during pregnancy. Do not take any medicines unless approved by your health care provider. Take a prenatal vitamin that contains at least 600 micrograms (mcg) of folic acid. Eating and drinking Eat a healthy diet that includes fresh fruits and vegetables, whole grains, good sources of protein such as meat, eggs, or tofu, and low-fat dairy products. Avoid raw meat and unpasteurized juice, milk, and cheese. These carry germs that can harm you and your baby. You may need to take these actions to prevent or treat constipation: Drink enough fluid to keep your urine pale yellow. Eat foods that are high in fiber, such as beans, whole grains, and fresh fruits and vegetables. Limit foods that are high in fat and processed sugars, such as fried or sweet foods. Activity Exercise only as directed by your health care provider. Most people can continue their usual exercise routine during pregnancy. Try to exercise for 30 minutes at least 5 days a week. Stop exercising if you develop contractions in your uterus. Stop exercising if you develop pain or cramping in the lower abdomen or lower back. Avoid exercising if it is very hot or humid or if you are at a high altitude. Avoid heavy lifting. If you choose  to, you may have sex unless your health care provider tells you not to. Relieving pain and discomfort Wear a supportive bra to prevent discomfort from breast tenderness. Take warm sitz baths to soothe any pain or discomfort caused by hemorrhoids. Use hemorrhoid cream if your health care provider approves. Rest with your legs raised (elevated) if you have leg  cramps or low back pain. If you develop varicose veins: Wear support hose as told by your health care provider. Elevate your feet for 15 minutes, 3-4 times a day. Limit salt in your diet. Safety Wear your seat belt at all times when driving or riding in a car. Talk with your health care provider if someone is verbally or physically abusive to you. Lifestyle Do not use hot tubs, steam rooms, or saunas. Do not douche. Do not use tampons or scented sanitary pads. Avoid cat litter boxes and soil used by cats. These carry germs that can cause birth defects in the baby and possibly loss of the fetus by miscarriage or stillbirth. Do not use herbal remedies, alcohol, illegal drugs, or medicines that are not approved by your health care provider. Chemicals in these products can harm your baby. Do not use any products that contain nicotine or tobacco, such as cigarettes, e-cigarettes, and chewing tobacco. If you need help quitting, ask your health care provider. General instructions During a routine prenatal visit, your health care provider will do a physical exam and other tests. He or she will also discuss your overall health. Keep all follow-up visits. This is important. Ask your health care provider for a referral to a local prenatal education class. Ask for help if you have counseling or nutritional needs during pregnancy. Your health care provider can offer advice or refer you to specialists for help with various needs. Where to find more information American Pregnancy Association: americanpregnancy.org [employer]necologists: https://www.todd-brady.net/ Office on [employer] Health: MightyReward.co.nz Contact a health care provider if you have: A headache that does not go away when you take medicine. Vision changes or you see spots in front of your eyes. Mild pelvic cramps, pelvic pressure, or nagging pain in the abdominal area. Persistent nausea,  vomiting, or diarrhea. A bad-smelling vaginal discharge or foul-smelling urine. Pain when you urinate. Sudden or extreme swelling of your face, hands, ankles, feet, or legs. A fever. Get help right away if you: Have fluid leaking from your vagina. Have spotting or bleeding from your vagina. Have severe abdominal cramping or pain. Have difficulty breathing. Have chest pain. Have fainting spells. Have not felt your baby move for the time period told by your health care provider. Have new or increased pain, swelling, or redness in an arm or leg. Summary The second trimester of pregnancy is from week 13 through week 27 (months 4 through 6). Do not use herbal remedies, alcohol, illegal drugs, or medicines that are not approved by your health care provider. Chemicals in these products can harm your baby. Exercise only as directed by your health care provider. Most people can continue their usual exercise routine during pregnancy. Keep all follow-up visits. This is important. This information is not intended to replace advice given to you by your health care provider. Make sure you discuss any questions you have with your health care provider. Document Revised: 05/01/2020 Document Reviewed: 03/07/2020 Elsevier Patient Education  2022 [employer].

## 2021-10-07 ENCOUNTER — Encounter: Payer: Self-pay | Admitting: Obstetrics & Gynecology

## 2021-10-07 ENCOUNTER — Other Ambulatory Visit: Payer: Self-pay

## 2021-10-07 ENCOUNTER — Encounter: Payer: BC Managed Care – PPO | Admitting: Obstetrics and Gynecology

## 2021-10-07 ENCOUNTER — Ambulatory Visit (INDEPENDENT_AMBULATORY_CARE_PROVIDER_SITE_OTHER): Payer: BC Managed Care – PPO | Admitting: Obstetrics & Gynecology

## 2021-10-07 VITALS — BP 120/80 | Wt 162.0 lb

## 2021-10-07 DIAGNOSIS — Z3A23 23 weeks gestation of pregnancy: Secondary | ICD-10-CM

## 2021-10-07 DIAGNOSIS — Z3482 Encounter for supervision of other normal pregnancy, second trimester: Secondary | ICD-10-CM

## 2021-10-07 LAB — POCT URINALYSIS DIPSTICK OB
Glucose, UA: NEGATIVE
POC,PROTEIN,UA: NEGATIVE

## 2021-10-07 NOTE — Patient Instructions (Addendum)
Round Ligament Pain The round ligaments are a pair of cord-like tissues that help support the uterus. They can become a source of pain during pregnancy as the ligaments soften and stretch as the baby grows. The pain usually begins in the second trimester (13-28 weeks) of pregnancy, and should only last for a few seconds when it occurs. However, the pain can come and go until the baby is delivered. The pain does not cause harm to the baby. Round ligament pain is usually a short, sharp, and pinching pain, but it can also be a dull, lingering, and aching pain. The pain is felt in the lower side of the abdomen or in the groin. It usually starts deep in the groin and moves up to the outside of the hip area. The pain may happen when you: Suddenly change position, such as quickly going from a sitting to standing position. Do physical activity. Cough or sneeze. Follow these instructions at home: Managing pain  When the pain starts, relax. Then, try any of these methods to help with the pain: Sit down. Flex your knees up to your abdomen. Lie on your side with one pillow under your abdomen and another pillow between your legs. Sit in a warm bath for 15-20 minutes or until the pain goes away. General instructions Watch your condition for any changes. Move slowly when you sit down or stand up. Stop or reduce your physical activities if they cause pain. Avoid long walks if they cause pain. Take over-the-counter and prescription medicines only as told by your health care provider. Keep all follow-up visits. This is important. Contact a health care provider if: Your pain does not go away with treatment. You feel pain in your back that you did not have before. Your medicine is not helping. You have a fever or chills. You have nausea or vomiting. You have diarrhea. You have pain when you urinate. Get help right away if: You have pain that is a rhythmic, cramping pain similar to labor pains. Labor pains  are usually 2 minutes apart, last for about 1 minute, and involve a bearing down feeling or pressure in your pelvis. You have vaginal bleeding. These symptoms may represent a serious problem that is an emergency. Do not wait to see if the symptoms will go away. Get medical help right away. Call your local emergency services (911 in the [place] Do not drive yourself to the hospital. Summary Round ligament pain is felt in the lower abdomen or groin. This pain usually begins in the second trimester (13-28 weeks) and should only last for a few seconds when it occurs. You may notice the pain when you suddenly change position, when you cough or sneeze, or during physical activity. Relaxing, flexing your knees to your abdomen, lying on one side, or taking a warm bath may help to get rid of the pain. Contact your health care provider if the pain does not go away. This information is not intended to replace advice given to you by your health care provider. Make sure you discuss any questions you have with your health care provider. Document Revised: 02/05/2021 Document Reviewed: 02/05/2021 Elsevier Patient Education  2022 Elsevier Inc.  

## 2021-10-07 NOTE — Addendum Note (Signed)
Addended by: Cornelius Moras D on: 10/07/2021 03:42 PM   Modules accepted: Orders

## 2021-10-07 NOTE — Progress Notes (Signed)
  Subjective  Fetal Movement? yes Contractions? no Leaking Fluid? no Vaginal Bleeding? no  Objective  BP 120/80   Wt 162 lb (73.5 kg)   LMP 04/17/2021 (Exact Date)   BMI 28.70 kg/m  General: NAD Pumonary: no increased work of breathing Abdomen: gravid, non-tender Extremities: no edema Psychiatric: mood appropriate, affect full  Assessment  25 y.o. G2P0 at [redacted]w[redacted]d by  02/02/2022, by Ultrasound presenting for routine prenatal visit  Plan   Problem List Items Addressed This Visit      Other   Supervision of normal pregnancy - Primary  Other Visit Diagnoses    [redacted] weeks gestation of pregnancy        PNV, FMC Glucola nv Plans to breast feed Plans POP for contraception, then later OCPs  pregnancy 2 Problems (from 06/04/21 to present)      Nursing Staff Provider  Office Location  [facility] Dating  7 wk u/s  Language  English Anatomy US  Normal, complete  Flu Vaccine  Declines Genetic Screen  NIPS: declines  TDaP vaccine    Hgb A1C or  GTT Early : n/a Third trimester :   Covid Unvaccinated Covid-2020   LAB RESULTS   Rhogam  Not needed Blood Type O/Positive/-- (06/29 0916)   Feeding Plan Breast Antibody Negative (06/29 0916)  Contraception POP Rubella 5.97 (06/29 0916)  Circumcision  RPR Non Reactive (06/29 0916)   Pediatrician   HBsAg Negative (06/29 0916)   Support Person Conner- HIV Non Reactive (06/29 0916)  Prenatal Classes Discussed Varicella Immune    GBS  (For PCN allergy, check sensitivities)   BTL Consent no    VBAC Consent n/a Pap 2021- NIL HPV +, repeat postpartum        Annamarie Major, MD, [facility], [facility] 10/07/2021  3:37 PM

## 2021-11-04 ENCOUNTER — Other Ambulatory Visit: Payer: BC Managed Care – PPO

## 2021-11-04 ENCOUNTER — Other Ambulatory Visit: Payer: Self-pay

## 2021-11-04 ENCOUNTER — Ambulatory Visit (INDEPENDENT_AMBULATORY_CARE_PROVIDER_SITE_OTHER): Payer: BC Managed Care – PPO | Admitting: Obstetrics and Gynecology

## 2021-11-04 ENCOUNTER — Other Ambulatory Visit: Payer: Self-pay | Admitting: Obstetrics and Gynecology

## 2021-11-04 VITALS — BP 110/70 | Wt 169.0 lb

## 2021-11-04 DIAGNOSIS — Z131 Encounter for screening for diabetes mellitus: Secondary | ICD-10-CM

## 2021-11-04 DIAGNOSIS — Z3A27 27 weeks gestation of pregnancy: Secondary | ICD-10-CM

## 2021-11-04 DIAGNOSIS — Z130 Encounter for screening for diseases of the blood and blood-forming organs and certain disorders involving the immune mechanism: Secondary | ICD-10-CM

## 2021-11-04 DIAGNOSIS — Z3482 Encounter for supervision of other normal pregnancy, second trimester: Secondary | ICD-10-CM

## 2021-11-04 DIAGNOSIS — Z113 Encounter for screening for infections with a predominantly sexual mode of transmission: Secondary | ICD-10-CM

## 2021-11-04 LAB — POCT URINALYSIS DIPSTICK OB
Glucose, UA: NEGATIVE
POC,PROTEIN,UA: NEGATIVE

## 2021-11-04 NOTE — Progress Notes (Signed)
ROB- 1 hr GTT, no concerns 

## 2021-11-04 NOTE — Progress Notes (Signed)
  Subjective  Fetal Movement? yes Contractions? no Leaking Fluid? no Vaginal Bleeding? no PNVs? Yes Doing well.  Breast, POPs vs condoms,  Declines flu  Objective  BP 110/70   Wt 169 lb (76.7 kg)   LMP 04/17/2021 (Exact Date)   BMI 29.94 kg/m  General: NAD Pulmonary: no increased work of breathing Abdomen: gravid, non-tender Extremities: no edema Psychiatric: mood appropriate, affect full  Assessment  25 y.o. G2P0 at [redacted]w[redacted]d by  02/02/2022, by Ultrasound presenting for routine prenatal visit  Plan   Problem List Items Addressed This Visit      Other   Supervision of normal pregnancy   Relevant Orders   28 Week RH+Panel  Other Visit Diagnoses    Screening for diabetes mellitus    -  Primary   Relevant Orders   28 Week RH+Panel   Screening for STD (sexually transmitted disease)       Relevant Orders   28 Week RH+Panel   Screening for deficiency anemia       Relevant Orders   28 Week RH+Panel   [redacted] weeks gestation of pregnancy       Relevant Orders   28 Week RH+Panel   POC Urinalysis Dipstick OB (Completed)     Labs: 28 wk labs today  RTO 3 and 5 weeks  Kathryn Krikorian B. Kripa Foskey, PA-C [facility],  11/04/2021  10:56 AM

## 2021-11-05 LAB — 28 WEEK RH+PANEL
Basophils Absolute: 0 10*3/uL (ref 0.0–0.2)
Basos: 0 %
EOS (ABSOLUTE): 0 10*3/uL (ref 0.0–0.4)
Eos: 0 %
Gestational Diabetes Screen: 102 mg/dL (ref 70–139)
HIV Screen 4th Generation wRfx: NONREACTIVE
Hematocrit: 34.6 % (ref 34.0–46.6)
Hemoglobin: 11.4 g/dL (ref 11.1–15.9)
Immature Grans (Abs): 0.1 10*3/uL (ref 0.0–0.1)
Immature Granulocytes: 2 %
Lymphocytes Absolute: 1 10*3/uL (ref 0.7–3.1)
Lymphs: 12 %
MCH: 30.5 pg (ref 26.6–33.0)
MCHC: 32.9 g/dL (ref 31.5–35.7)
MCV: 93 fL (ref 79–97)
Monocytes Absolute: 1 10*3/uL — ABNORMAL HIGH (ref 0.1–0.9)
Monocytes: 12 %
Neutrophils Absolute: 5.8 10*3/uL (ref 1.4–7.0)
Neutrophils: 74 %
Platelets: 241 10*3/uL (ref 150–450)
RBC: 3.74 x10E6/uL — ABNORMAL LOW (ref 3.77–5.28)
RDW: 12.4 % (ref 11.7–15.4)
RPR Ser Ql: NONREACTIVE
WBC: 7.9 10*3/uL (ref 3.4–10.8)

## 2021-11-06 ENCOUNTER — Observation Stay
Admission: EM | Admit: 2021-11-06 | Discharge: 2021-11-06 | Disposition: A | Discharge status: Home / Self Care | Payer: BC Managed Care – PPO | Attending: Obstetrics & Gynecology | Admitting: Obstetrics & Gynecology

## 2021-11-06 DIAGNOSIS — N898 Other specified noninflammatory disorders of vagina: Secondary | ICD-10-CM

## 2021-11-06 DIAGNOSIS — O26892 Other specified pregnancy related conditions, second trimester: Secondary | ICD-10-CM | POA: Diagnosis not present

## 2021-11-06 DIAGNOSIS — O4290 Premature rupture of membranes, unspecified as to length of time between rupture and onset of labor, unspecified weeks of gestation: Secondary | ICD-10-CM

## 2021-11-06 DIAGNOSIS — O99891 Other specified diseases and conditions complicating pregnancy: Secondary | ICD-10-CM | POA: Diagnosis present

## 2021-11-06 DIAGNOSIS — O09893 Supervision of other high risk pregnancies, third trimester: Secondary | ICD-10-CM | POA: Diagnosis not present

## 2021-11-06 DIAGNOSIS — Z3A27 27 weeks gestation of pregnancy: Secondary | ICD-10-CM

## 2021-11-06 HISTORY — DX: Other specified noninflammatory disorders of vagina: N89.8

## 2021-11-06 HISTORY — DX: Premature rupture of membranes, unspecified as to length of time between rupture and onset of labor, unspecified weeks of gestation: O42.90

## 2021-11-06 LAB — URINALYSIS, ROUTINE W REFLEX MICROSCOPIC
Bilirubin Urine: NEGATIVE
Glucose, UA: NEGATIVE mg/dL
Hgb urine dipstick: NEGATIVE
Ketones, ur: 80 mg/dL — AB
Leukocytes,Ua: NEGATIVE
Nitrite: NEGATIVE
Protein, ur: NEGATIVE mg/dL
Specific Gravity, Urine: 1.01 (ref 1.005–1.030)
pH: 7 (ref 5.0–8.0)

## 2021-11-06 LAB — RUPTURE OF MEMBRANE (ROM)PLUS: Rom Plus: NEGATIVE

## 2021-11-06 MED ORDER — ONDANSETRON HCL 4 MG/2ML IJ SOLN: 4.0000 mg | Freq: Four times a day (QID) | INTRAMUSCULAR | Status: DC | PRN

## 2021-11-06 MED ORDER — LIDOCAINE HCL (PF) 1 % IJ SOLN: 30.0000 mL | INTRAMUSCULAR | Status: DC | PRN

## 2021-11-06 MED ORDER — ACETAMINOPHEN 325 MG PO TABS: 650.0000 mg | ORAL_TABLET | ORAL | Status: DC | PRN

## 2021-11-06 NOTE — H&P (Signed)
Obstetrics Admission History & Physical   CC: vaginal discharge  HPI:  25 y.o. G2P0 @ [redacted]w[redacted]d (02/02/2022, by Ultrasound). Admitted on 11/06/2021:   Patient Active Problem List   Diagnosis Date Noted   Amniotic fluid leaking 11/06/2021   Vaginal discharge 11/06/2021   Supervision of normal pregnancy 06/04/2021   Cervical dysplasia 04/23/2021   History of ectopic pregnancy    Intractable migraine without aura and without status migrainosus 10/08/2019     Presents for episode of leakage of fluid from vagina 2 times this am, first at 0615 with her getting into car for work [employer]) and thwn again less than an hour later.  No pain, ctxs, bleeding, and she has good fetal movement.  Prenatal care at: at [facility]. Pregnancy complicated by none.  ROS: A review of systems was performed and negative, except as stated in the above HPI. PMHx:  Past Medical History:  Diagnosis Date   Migraine    PSHx:  Past Surgical History:  Procedure Laterality Date   WISDOM TOOTH EXTRACTION     Medications:  Medications Prior to Admission  Medication Sig Dispense Refill Last Dose   Prenatal Vit-Fe Fumarate-FA (PRENATAL MULTIVITAMIN) TABS tablet Take 1 tablet by mouth daily at 12 noon.      ondansetron (ZOFRAN ODT) 4 MG disintegrating tablet Take 1 tablet (4 mg total) by mouth every 6 (six) hours as needed for nausea. (Patient not taking: Reported on 11/06/2021) 40 tablet 2 Not Taking   Allergies: has No Known Allergies. OBHx:  OB History  Gravida Para Term Preterm AB Living  2            SAB IAB Ectopic Multiple Live Births               # Outcome Date GA Lbr Len/2nd Weight Sex Delivery Anes PTL Lv  2 Current           1 Gravida            WYO:VZCHYIFO/YDXAJOINOMVE except as detailed in HPI.Marland Kitchen  No family history of birth defects. Soc Hx: Never smoker, Alcohol: none, and Recreational drug use: none  Objective:   Vitals:   11/06/21 0856  Resp: 16  Temp: 97.7 F (36.5 C)   Constitutional:  Well nourished, well developed female in no acute distress.  HEENT: normal Skin: Warm and dry.  Cardiovascular:Regular rate and rhythm.   Extremity: trace to 1+ bilateral pedal edema Respiratory: Clear to auscultation bilateral. Normal respiratory effort Abdomen: gravid, NT, ND, FHT present, without guarding, without rebound tenderness on exam Back: no CVAT Neuro: DTRs 2+, Cranial nerves grossly intact Psych: Alert and Oriented x3. No memory deficits. Normal mood and affect.  MS: normal gait, normal bilateral lower extremity ROM/strength/stability.  Pelvic exam: Swab for ROM PLUS only  A NST procedure was performed with FHR monitoring and a normal baseline established, appropriate time of 20-40 minutes of evaluation, and accels >2 seen w 10x10 characteristics.  Results show a REACTIVE NST.   No contractions on monitor  Perinatal info:  Blood type: O positive Rubella- Immune Varicella -Immune TDaP tetanus status unknown to the patient RPR NR / HIV Neg/ HBsAg Neg   Assessment & Plan:   25 y.o. G2P0 @ [redacted]w[redacted]d, Admitted on 11/06/2021:Vaginal discharge, evaluate for PPROM    ROM Plus UA Fetal monitoring Monitor for infection signs  Annamarie Major, MD, [facility], [facility] 11/06/2021  10:04 AM

## 2021-11-06 NOTE — OB Triage Note (Signed)
Here for complaint of leaking of fluid a couple of times this morning.

## 2021-11-06 NOTE — Discharge Summary (Signed)
Physician Discharge Summary  Patient ID: Kathryn Wong MRN: [id number] DOB/AGE: 02/26/1996 25 y.o.  Admit date: 11/06/2021 Discharge date: 11/06/2021  Admission Diagnoses:Principal Problem:   Amniotic fluid leaking Active Problems:   Vaginal discharge  Discharge Diagnoses:  Principal Problem:   Amniotic fluid  NOT leaking Active Problems:   Vaginal discharge  Discharged Condition: good  Hospital Course: Pt seen and examined for vaginal leakage of fluid, which was negative for amniotic fluid on testing.  No s/sx infection or labor.  Will monitor as outpatient for further leakage symptoms.  Consults: None  Significant Diagnostic Studies: A NST procedure was performed with FHR monitoring and a normal baseline established, appropriate time of 20-40 minutes of evaluation, and accels >2 seen w 15x15 characteristics.  Results show a REACTIVE NST.   Treatments: none  Discharge Exam: Temperature 97.7 F (36.5 C), temperature source Oral, resp. rate 16, last menstrual period 04/17/2021, unknown if currently breastfeeding. General appearance: alert, cooperative, and no distress No change  Disposition: Discharge disposition: 01-Home or Self Care       Discharge Instructions     Call MD for:   Complete by: As directed    Worsening contractions or pain; leakage of fluid; bleeding.   Diet - low sodium heart healthy   Complete by: As directed    Increase activity slowly   Complete by: As directed       Allergies as of 11/06/2021   No Known Allergies      Medication List     STOP taking these medications    ondansetron 4 MG disintegrating tablet Commonly known as: Zofran ODT       TAKE these medications    prenatal multivitamin Tabs tablet Take 1 tablet by mouth daily at 12 noon.        Follow-up Information     [facility]. Go to.   Specialty: Obstetrics and Gynecology Why: As Scheduled Contact information: [address] [place] [address] [phone]                Signed: Letitia Libra 11/06/2021, 11:15 AM

## 2021-11-10 ENCOUNTER — Telehealth: Payer: Self-pay

## 2021-11-10 NOTE — Telephone Encounter (Signed)
Leah from [employer] calling to verify that AMS will be the signing MD for breast pump order.  Ref # [id number]  PH# [phone]

## 2021-11-25 ENCOUNTER — Ambulatory Visit (INDEPENDENT_AMBULATORY_CARE_PROVIDER_SITE_OTHER): Payer: BC Managed Care – PPO | Admitting: Obstetrics and Gynecology

## 2021-11-25 ENCOUNTER — Other Ambulatory Visit: Payer: Self-pay

## 2021-11-25 VITALS — BP 112/68 | Wt 174.0 lb

## 2021-11-25 DIAGNOSIS — Z3A30 30 weeks gestation of pregnancy: Secondary | ICD-10-CM

## 2021-11-25 DIAGNOSIS — Z3482 Encounter for supervision of other normal pregnancy, second trimester: Secondary | ICD-10-CM

## 2021-11-25 DIAGNOSIS — Z3483 Encounter for supervision of other normal pregnancy, third trimester: Secondary | ICD-10-CM

## 2021-11-25 DIAGNOSIS — Z23 Encounter for immunization: Secondary | ICD-10-CM

## 2021-11-25 LAB — POCT URINALYSIS DIPSTICK OB
Glucose, UA: NEGATIVE
POC,PROTEIN,UA: NEGATIVE

## 2021-11-25 NOTE — Progress Notes (Signed)
ROB - no concerns. RM 4 °

## 2021-11-25 NOTE — Progress Notes (Signed)
° ° °  Routine Prenatal Care Visit  Subjective  Kathryn Wong is a 25 y.o. G2P0 at [redacted]w[redacted]d being seen today for ongoing prenatal care.  She is currently monitored for the following issues for this low-risk pregnancy and has Intractable migraine without aura and without status migrainosus; History of ectopic pregnancy; Cervical dysplasia; Supervision of normal pregnancy; Amniotic fluid leaking; and Vaginal discharge on their problem list.  ----------------------------------------------------------------------------------- Patient reports no complaints.   Contractions: Not present. Vag. Bleeding: None.  Movement: Present. Denies leaking of fluid.  ----------------------------------------------------------------------------------- The following portions of the patient's history were reviewed and updated as appropriate: allergies, current medications, past family history, past medical history, past social history, past surgical history and problem list. Problem list updated.   Objective  Blood pressure 112/68, weight 174 lb (78.9 kg), last menstrual period 04/17/2021, unknown if currently breastfeeding. Pregravid weight 155 lb (70.3 kg) Total Weight Gain 19 lb (8.618 kg) Urinalysis:      Fetal Status: Fetal Heart Rate (bpm): 145 Fundal Height: 29 cm Movement: Present  Presentation: Vertex  General:  Alert, oriented and cooperative. Patient is in no acute distress.  Skin: Skin is warm and dry. No rash noted.   Cardiovascular: Normal heart rate noted  Respiratory: Normal respiratory effort, no problems with respiration noted  Abdomen: Soft, gravid, appropriate for gestational age. Pain/Pressure: Absent     Pelvic:  Cervical exam deferred        Extremities: Normal range of motion.     ental Status: Normal mood and affect. Normal behavior. Normal judgment and thought content.     Assessment   24 y.o. G2P0 at [redacted]w[redacted]d by  02/02/2022, by Ultrasound presenting for routine prenatal visit  Plan    pregnancy 2 Problems (from 06/04/21 to present)     Problem Noted Resolved   Supervision of normal pregnancy 06/04/2021 by Vena Austria, MD No   Overview Addendum 11/25/2021  8:54 AM by Vena Austria, MD     Nursing Staff Provider  Office Location  [facility] Dating  7 wk u/s  Language  English Anatomy US  Normal, complete  Flu Vaccine  Declines Genetic Screen  NIPS: declines  TDaP vaccine    Hgb A1C or  GTT Early : n/a Third trimester : 102  Covid Unvaccinated Covid-2020   LAB RESULTS   Rhogam  Not needed Blood Type O/Positive/-- (06/29 0916)   Feeding Plan Breast Antibody Negative (06/29 0916)  Contraception POP Rubella 5.97 (06/29 0916)  Circumcision  RPR Non Reactive (06/29 0916)   Pediatrician   HBsAg Negative (06/29 0916)   Support Person Conner- HIV Non Reactive (06/29 0916)  Prenatal Classes Discussed Varicella Immune    GBS  (For PCN allergy, check sensitivities)   BTL Consent no    VBAC Consent n/a Pap 2021- NIL HPV +, repeat postpartum          History of ectopic pregnancy  by Vena Austria, MD No        Gestational age appropriate obstetric precautions including but not limited to vaginal bleeding, contractions, leaking of fluid and fetal movement were reviewed in detail with the patient.    Return in about 2 weeks (around 12/09/2021) for ROB every 2 weeks x 3 visits.  Vena Austria, MD, [facility], [facility] 11/25/2021, 10:03 AM

## 2021-12-07 NOTE — L&D Delivery Note (Signed)
Operative Delivery Note ?At 12:14 AM a viable female was delivered via Vaginal, Vacuum Neurosurgeon).  Presentation: vertex; Position: Occiput Anterior,; Station: +1. ? ?Verbal consent: obtained from patient.  Risks and benefits discussed in detail.  Risks include, but are not limited to the risks of anesthesia, bleeding, infection, damage to maternal tissues, fetal cephalhematoma.  There is also the risk of inability to effect vaginal delivery of the head, or shoulder dystocia that cannot be resolved by established maneuvers, leading to the need for emergency cesarean section. ? ?APGAR: 8, 9; weight  pending.   ?Placenta status: spontaneous.   ?Cord:3v. ? ?Anesthesia:  Epidural ?Instruments: Flat Kiwi, 3 applications, no pop-offs; then she pushes it out on her own on the first ctx after the 3 ctxs we used th e vacuum ?Episiotomy: None ?Lacerations: 2nd degree;Perineal ?Suture Repair: 2.0 vicryl ?Est. Blood Loss (mL):  200 mL ? ?Mom to postpartum.  Baby to Couplet care / Skin to Skin. ? ?Kathryn Wong ?02/10/2022, 12:29 AM ? ? ? ?

## 2021-12-08 ENCOUNTER — Encounter: Payer: Self-pay | Admitting: Obstetrics and Gynecology

## 2021-12-09 ENCOUNTER — Encounter: Payer: Self-pay | Admitting: Advanced Practice Midwife

## 2021-12-09 ENCOUNTER — Other Ambulatory Visit: Payer: Self-pay

## 2021-12-09 ENCOUNTER — Ambulatory Visit (INDEPENDENT_AMBULATORY_CARE_PROVIDER_SITE_OTHER): Payer: BC Managed Care – PPO | Admitting: Advanced Practice Midwife

## 2021-12-09 VITALS — BP 120/80 | Wt 179.0 lb

## 2021-12-09 DIAGNOSIS — Z3A32 32 weeks gestation of pregnancy: Secondary | ICD-10-CM

## 2021-12-09 DIAGNOSIS — Z3483 Encounter for supervision of other normal pregnancy, third trimester: Secondary | ICD-10-CM

## 2021-12-09 LAB — POCT URINALYSIS DIPSTICK OB
Glucose, UA: NEGATIVE
POC,PROTEIN,UA: NEGATIVE

## 2021-12-09 NOTE — Addendum Note (Signed)
Addended by: Quintella Baton D on: 12/09/2021 03:49 PM   Modules accepted: Orders

## 2021-12-09 NOTE — Progress Notes (Signed)
°  Routine Prenatal Care Visit  Subjective  Kathryn Wong is a 26 y.o. G2P0 at [redacted]w[redacted]d being seen today for ongoing prenatal care.  She is currently monitored for the following issues for this low-risk pregnancy and has Intractable migraine without aura and without status migrainosus; History of ectopic pregnancy; Cervical dysplasia; Supervision of normal pregnancy; Amniotic fluid leaking; and Vaginal discharge on their problem list.  ----------------------------------------------------------------------------------- Patient reports heartburn.  Comfort measures reviewed. Contractions: Not present. Vag. Bleeding: None.  Movement: Present. Leaking Fluid denies.  ----------------------------------------------------------------------------------- The following portions of the patient's history were reviewed and updated as appropriate: allergies, current medications, past family history, past medical history, past social history, past surgical history and problem list. Problem list updated.  Objective  Blood pressure 120/80, weight 179 lb (81.2 kg), last menstrual period 04/17/2021, unknown if currently breastfeeding. Pregravid weight 155 lb (70.3 kg) Total Weight Gain 24 lb (10.9 kg) Urinalysis: Urine Protein    Urine Glucose    Fetal Status: Fetal Heart Rate (bpm): 123 Fundal Height: 32 cm Movement: Present     General:  Alert, oriented and cooperative. Patient is in no acute distress.  Skin: Skin is warm and dry. No rash noted.   Cardiovascular: Normal heart rate noted  Respiratory: Normal respiratory effort, no problems with respiration noted  Abdomen: Soft, gravid, appropriate for gestational age. Pain/Pressure: Absent     Pelvic:  Cervical exam deferred        Extremities: Normal range of motion.  Edema: None  Mental Status: Normal mood and affect. Normal behavior. Normal judgment and thought content.   Assessment   26 y.o. G2P0 at [redacted]w[redacted]d by  02/02/2022, by Ultrasound presenting for routine  prenatal visit  Plan   pregnancy 2 Problems (from 06/04/21 to present)    Problem Noted Resolved   Supervision of normal pregnancy 06/04/2021 by Vena Austria, MD No   Overview Addendum 11/25/2021  8:54 AM by Vena Austria, MD     Nursing Staff Provider  Office Location  [facility] Dating  7 wk u/s  Language  English Anatomy US  Normal, complete  Flu Vaccine  Declines Genetic Screen  NIPS: declines  TDaP vaccine    Hgb A1C or  GTT Early : n/a Third trimester : 102  Covid Unvaccinated Covid-2020   LAB RESULTS   Rhogam  Not needed Blood Type O/Positive/-- (06/29 0916)   Feeding Plan Breast Antibody Negative (06/29 0916)  Contraception POP Rubella 5.97 (06/29 0916)  Circumcision  RPR Non Reactive (06/29 0916)   Pediatrician   HBsAg Negative (06/29 0916)   Support Person Conner- HIV Non Reactive (06/29 0916)  Prenatal Classes Discussed Varicella Immune    GBS  (For PCN allergy, check sensitivities)   BTL Consent no    VBAC Consent n/a Pap 2021- NIL HPV +, repeat postpartum          History of ectopic pregnancy  by Vena Austria, MD No       Preterm labor symptoms and general obstetric precautions including but not limited to vaginal bleeding, contractions, leaking of fluid and fetal movement were reviewed in detail with the patient. Please refer to After Visit Summary for other counseling recommendations.   Return in about 2 weeks (around 12/23/2021) for rob.  Tresea Mall, CNM 12/09/2021 3:47 PM

## 2021-12-23 ENCOUNTER — Ambulatory Visit (INDEPENDENT_AMBULATORY_CARE_PROVIDER_SITE_OTHER): Payer: BC Managed Care – PPO | Admitting: Obstetrics and Gynecology

## 2021-12-23 ENCOUNTER — Other Ambulatory Visit: Payer: Self-pay

## 2021-12-23 VITALS — BP 120/80 | Wt 185.0 lb

## 2021-12-23 DIAGNOSIS — Z3403 Encounter for supervision of normal first pregnancy, third trimester: Secondary | ICD-10-CM

## 2021-12-23 DIAGNOSIS — Z3A34 34 weeks gestation of pregnancy: Secondary | ICD-10-CM

## 2021-12-23 LAB — POCT URINALYSIS DIPSTICK OB
Glucose, UA: NEGATIVE
POC,PROTEIN,UA: NEGATIVE

## 2021-12-23 NOTE — Progress Notes (Signed)
° ° °  Routine Prenatal Care Visit  Subjective  Kathryn Wong is a 26 y.o. G2P0 at [redacted]w[redacted]d being seen today for ongoing prenatal care.  She is currently monitored for the following issues for this low-risk pregnancy and has Intractable migraine without aura and without status migrainosus; History of ectopic pregnancy; Cervical dysplasia; Supervision of normal pregnancy; Amniotic fluid leaking; and Vaginal discharge on their problem list.  ----------------------------------------------------------------------------------- Patient reports no complaints.   Contractions: Not present. Vag. Bleeding: None.  Movement: Present. Denies leaking of fluid.  ----------------------------------------------------------------------------------- The following portions of the patient's history were reviewed and updated as appropriate: allergies, current medications, past family history, past medical history, past social history, past surgical history and problem list. Problem list updated.   Objective  Blood pressure 120/80, weight 185 lb (83.9 kg), last menstrual period 04/17/2021, unknown if currently breastfeeding. Pregravid weight 155 lb (70.3 kg) Total Weight Gain 30 lb (13.6 kg) Urinalysis:      Fetal Status: Fetal Heart Rate (bpm): 140 Fundal Height: 34 cm Movement: Present     General:  Alert, oriented and cooperative. Patient is in no acute distress.  Skin: Skin is warm and dry. No rash noted.   Cardiovascular: Normal heart rate noted  Respiratory: Normal respiratory effort, no problems with respiration noted  Abdomen: Soft, gravid, appropriate for gestational age. Pain/Pressure: Absent     Pelvic:  Cervical exam deferred        Extremities: Normal range of motion.     ental Status: Normal mood and affect. Normal behavior. Normal judgment and thought content.     Assessment   26 y.o. G2P0 at [redacted]w[redacted]d by  02/02/2022, by Ultrasound presenting for routine prenatal visit  Plan   pregnancy 2 Problems  (from 06/04/21 to present)     Problem Noted Resolved   Supervision of normal pregnancy 06/04/2021 by Vena Austria, MD No   Overview Addendum 11/25/2021  8:54 AM by Vena Austria, MD     Nursing Staff Provider  Office Location  [facility] Dating  7 wk u/s  Language  English Anatomy US  Normal, complete  Flu Vaccine  Declines Genetic Screen  NIPS: declines  TDaP vaccine    Hgb A1C or  GTT Early : n/a Third trimester : 102  Covid Unvaccinated Covid-2020   LAB RESULTS   Rhogam  Not needed Blood Type O/Positive/-- (06/29 0916)   Feeding Plan Breast Antibody Negative (06/29 0916)  Contraception POP Rubella 5.97 (06/29 0916)  Circumcision  RPR Non Reactive (06/29 0916)   Pediatrician   HBsAg Negative (06/29 0916)   Support Person Conner- HIV Non Reactive (06/29 0916)  Prenatal Classes Discussed Varicella Immune    GBS  (For PCN allergy, check sensitivities)   BTL Consent no    VBAC Consent n/a Pap 2021- NIL HPV +, repeat postpartum          History of ectopic pregnancy  by Vena Austria, MD No        Gestational age appropriate obstetric precautions including but not limited to vaginal bleeding, contractions, leaking of fluid and fetal movement were reviewed in detail with the patient.    Return in about 2 weeks (around 01/06/2022) for ROB.  Vena Austria, MD, [facility], [facility] 12/23/2021, 3:57 PM

## 2021-12-23 NOTE — Addendum Note (Signed)
Addended by: Cornelius Moras D on: 12/23/2021 04:04 PM   Modules accepted: Orders

## 2021-12-24 ENCOUNTER — Encounter: Payer: Self-pay | Admitting: Obstetrics and Gynecology

## 2021-12-24 ENCOUNTER — Observation Stay
Admission: EM | Admit: 2021-12-24 | Discharge: 2021-12-24 | Disposition: A | Discharge status: Home / Self Care | Payer: BC Managed Care – PPO | Attending: Obstetrics and Gynecology | Admitting: Obstetrics and Gynecology

## 2021-12-24 ENCOUNTER — Other Ambulatory Visit: Payer: Self-pay

## 2021-12-24 DIAGNOSIS — O4703 False labor before 37 completed weeks of gestation, third trimester: Secondary | ICD-10-CM | POA: Diagnosis present

## 2021-12-24 DIAGNOSIS — O479 False labor, unspecified: Secondary | ICD-10-CM | POA: Diagnosis present

## 2021-12-24 DIAGNOSIS — Z3A34 34 weeks gestation of pregnancy: Secondary | ICD-10-CM | POA: Insufficient documentation

## 2021-12-24 NOTE — OB Triage Note (Signed)
Pt is a G2P0 presenting to L&D triage complaining of tightening/"contractions" in her mid abdomen. Pt says it is not painful but it is uncomfortable. Pt states she tried to rest and hydrate but it was still "consistent" so she decided to come in. Pt denies increased urgency, frequency, or burning when urinating. Pt denies abnormal vaginal discharge, LOF, and vaginal bleeding. Pt denies sexual intercourse in the last 48 hours. Monitors applied and assessing. VSS.

## 2021-12-24 NOTE — Discharge Summary (Signed)
Physician Final Progress Note  Patient ID: Kathryn Wong MRN: [id number] DOB/AGE: 01-12-1996 26 y.o.  Admit date: 12/24/2021 Admitting provider: Vena Austria, MD Discharge date: 12/24/2021   Admission Diagnoses: Braxton Hick's contractions  Discharge Diagnoses:  Principal Problem:   Braxton Hick's contraction   26 y.o. G2P0 at [redacted]w[redacted]d by Estimated Date of Delivery: 02/02/22 presenting with contractions.  Has had mild contractions all day, not painful, but despite adequate hydration.   On presentation was having very mild contractions based on palpation, cervix remains closed.  Reactive NST.  +FM, no LOF, no VB.  Discussed contraction patterns in third trimester  Consults: None  Significant Findings/ Diagnostic Studies: none  Procedures: NST 140, moderate, +accels, no decels Toc q2-30min  Discharge Condition: good  Disposition: Discharge disposition: 01-Home or Self Care       Diet: Regular diet  Discharge Activity: Activity as tolerated  Discharge Instructions     Discharge activity:  No Restrictions   Complete by: As directed    Discharge diet:  No restrictions   Complete by: As directed    Fetal Kick Count:  Lie on our left side for one hour after a meal, and count the number of times your baby kicks.  If it is less than 5 times, get up, move around and drink some juice.  Repeat the test 30 minutes later.  If it is still less than 5 kicks in an hour, notify your doctor.   Complete by: As directed    LABOR:  When conractions begin, you should start to time them from the beginning of one contraction to the beginning  of the next.  When contractions are 5 - 10 minutes apart or less and have been regular for at least an hour, you should call your health care provider.   Complete by: As directed    No sexual activity restrictions   Complete by: As directed    Notify physician for bleeding from the vagina   Complete by: As directed    Notify physician for blurring of  vision or spots before the eyes   Complete by: As directed    Notify physician for chills or fever   Complete by: As directed    Notify physician for fainting spells, "black outs" or loss of consciousness   Complete by: As directed    Notify physician for increase in vaginal discharge   Complete by: As directed    Notify physician for leaking of fluid   Complete by: As directed    Notify physician for pain or burning when urinating   Complete by: As directed    Notify physician for pelvic pressure (sudden increase)   Complete by: As directed    Notify physician for severe or continued nausea or vomiting   Complete by: As directed    Notify physician for sudden gushing of fluid from the vagina (with or without continued leaking)   Complete by: As directed    Notify physician for sudden, constant, or occasional abdominal pain   Complete by: As directed    Notify physician if baby moving less than usual   Complete by: As directed       Allergies as of 12/24/2021   No Known Allergies      Medication List     TAKE these medications    ondansetron 4 MG disintegrating tablet Commonly known as: ZOFRAN-ODT Take 4 mg by mouth every 6 (six) hours as needed.   prenatal multivitamin Tabs tablet Take 1  tablet by mouth daily at 12 noon.         Total time spent taking care of this patient: 30 minutes  Signed: Vena Austria 12/24/2021, 8:10 PM

## 2022-01-06 ENCOUNTER — Other Ambulatory Visit: Payer: Self-pay

## 2022-01-06 ENCOUNTER — Other Ambulatory Visit (HOSPITAL_COMMUNITY)
Admission: RE | Admit: 2022-01-06 | Discharge: 2022-01-06 | Disposition: A | Discharge status: Home / Self Care | Payer: BC Managed Care – PPO | Source: Ambulatory Visit | Attending: Obstetrics | Admitting: Obstetrics

## 2022-01-06 ENCOUNTER — Ambulatory Visit (INDEPENDENT_AMBULATORY_CARE_PROVIDER_SITE_OTHER): Payer: BC Managed Care – PPO | Admitting: Obstetrics

## 2022-01-06 VITALS — BP 128/84 | Wt 188.0 lb

## 2022-01-06 DIAGNOSIS — Z113 Encounter for screening for infections with a predominantly sexual mode of transmission: Secondary | ICD-10-CM | POA: Insufficient documentation

## 2022-01-06 DIAGNOSIS — Z3403 Encounter for supervision of normal first pregnancy, third trimester: Secondary | ICD-10-CM

## 2022-01-06 NOTE — Progress Notes (Signed)
Routine Prenatal Care Visit  Subjective  Kathryn Wong is a 26 y.o. G2P0 at [redacted]w[redacted]d being seen today for ongoing prenatal care.  She is currently monitored for the following issues for this low-risk pregnancy and has Intractable migraine without aura and without status migrainosus; History of ectopic pregnancy; Cervical dysplasia; Supervision of normal pregnancy; Amniotic fluid leaking; Vaginal discharge; and Braxton Hick's contraction on their problem list.  ----------------------------------------------------------------------------------- Patient reports no complaints.   Contractions: Irritability. Vag. Bleeding: None.  Movement: Present. Leaking Fluid denies.  ----------------------------------------------------------------------------------- The following portions of the patient's history were reviewed and updated as appropriate: allergies, current medications, past family history, past medical history, past social history, past surgical history and problem list. Problem list updated.  Objective  Blood pressure 128/84, weight 188 lb (85.3 kg), last menstrual period 04/17/2021, unknown if currently breastfeeding. Pregravid weight 155 lb (70.3 kg) Total Weight Gain 33 lb (15 kg) Urinalysis: Urine Protein    Urine Glucose    Fetal Status:     Movement: Present     General:  Alert, oriented and cooperative. Patient is in no acute distress.  Skin: Skin is warm and dry. No rash noted.   Cardiovascular: Normal heart rate noted  Respiratory: Normal respiratory effort, no problems with respiration noted  Abdomen: Soft, gravid, appropriate for gestational age. Pain/Pressure: Absent     Pelvic:  Cervical exam performed        Extremities: Normal range of motion.     Mental Status: Normal mood and affect. Normal behavior. Normal judgment and thought content.   Assessment   26 y.o. G2P0 at [redacted]w[redacted]d by  02/02/2022, by Ultrasound presenting for routine prenatal visit  Plan   pregnancy 2 Problems  (from 06/04/21 to present)    Problem Noted Resolved   Supervision of normal pregnancy 06/04/2021 by Vena Austria, MD No   Overview Addendum 11/25/2021  8:54 AM by Vena Austria, MD     Nursing Staff Provider  Office Location  [facility] Dating  7 wk u/s  Language  English Anatomy US  Normal, complete  Flu Vaccine  Declines Genetic Screen  NIPS: declines  TDaP vaccine    Hgb A1C or  GTT Early : n/a Third trimester : 102  Covid Unvaccinated Covid-2020   LAB RESULTS   Rhogam  Not needed Blood Type O/Positive/-- (06/29 0916)   Feeding Plan Breast Antibody Negative (06/29 0916)  Contraception POP Rubella 5.97 (06/29 0916)  Circumcision  RPR Non Reactive (06/29 0916)   Pediatrician   HBsAg Negative (06/29 0916)   Support Person Conner- HIV Non Reactive (06/29 0916)  Prenatal Classes Discussed Varicella Immune    GBS  (For PCN allergy, check sensitivities)   BTL Consent no    VBAC Consent n/a Pap 2021- NIL HPV +, repeat postpartum          History of ectopic pregnancy  by Vena Austria, MD No       Term labor symptoms and general obstetric precautions including but not limited to vaginal bleeding, contractions, leaking of fluid and fetal movement were reviewed in detail with the patient. Please refer to After Visit Summary for other counseling recommendations.  GBS, cx today. No dilation. Return in about 1 week (around 01/13/2022) for return OB.  Mirna Mires, CNM  01/06/2022 4:23 PM

## 2022-01-06 NOTE — Progress Notes (Signed)
No vb no lof.  

## 2022-01-08 ENCOUNTER — Encounter: Payer: Self-pay | Admitting: Obstetrics

## 2022-01-08 LAB — CERVICOVAGINAL ANCILLARY ONLY
Chlamydia: NEGATIVE
Comment: NEGATIVE
Comment: NORMAL
Neisseria Gonorrhea: NEGATIVE

## 2022-01-10 LAB — CULTURE, BETA STREP (GROUP B ONLY): Strep Gp B Culture: NEGATIVE

## 2022-01-13 ENCOUNTER — Ambulatory Visit (INDEPENDENT_AMBULATORY_CARE_PROVIDER_SITE_OTHER): Payer: BC Managed Care – PPO | Admitting: Obstetrics

## 2022-01-13 ENCOUNTER — Other Ambulatory Visit: Payer: Self-pay

## 2022-01-13 VITALS — BP 126/80 | Wt 190.0 lb

## 2022-01-13 DIAGNOSIS — Z3A37 37 weeks gestation of pregnancy: Secondary | ICD-10-CM

## 2022-01-13 DIAGNOSIS — Z3483 Encounter for supervision of other normal pregnancy, third trimester: Secondary | ICD-10-CM

## 2022-01-13 NOTE — Progress Notes (Signed)
NO VB. NO LOF.

## 2022-01-13 NOTE — Progress Notes (Signed)
Routine Prenatal Care Visit  Subjective  Kathryn Wong is a 26 y.o. G2P0 at [redacted]w[redacted]d being seen today for ongoing prenatal care.  She is currently monitored for the following issues for this low-risk pregnancy and has Intractable migraine without aura and without status migrainosus; History of ectopic pregnancy; Cervical dysplasia; Supervision of normal pregnancy; Amniotic fluid leaking; Vaginal discharge; and Braxton Hick's contraction on their problem list.  ----------------------------------------------------------------------------------- Patient reports no complaints.   Contractions: Irritability. Vag. Bleeding: None.  Movement: Present. Leaking Fluid denies.  ----------------------------------------------------------------------------------- The following portions of the patient's history were reviewed and updated as appropriate: allergies, current medications, past family history, past medical history, past social history, past surgical history and problem list. Problem list updated.  Objective  Blood pressure 126/80, weight 190 lb (86.2 kg), last menstrual period 04/17/2021, unknown if currently breastfeeding. Pregravid weight 155 lb (70.3 kg) Total Weight Gain 35 lb (15.9 kg) Urinalysis: Urine Protein    Urine Glucose    Fetal Status:     Movement: Present     General:  Alert, oriented and cooperative. Patient is in no acute distress.  Skin: Skin is warm and dry. No rash noted.   Cardiovascular: Normal heart rate noted  Respiratory: Normal respiratory effort, no problems with respiration noted  Abdomen: Soft, gravid, appropriate for gestational age. Pain/Pressure: Absent     Pelvic:  Cervical exam deferred        Extremities: Normal range of motion.     Mental Status: Normal mood and affect. Normal behavior. Normal judgment and thought content.   Assessment   26 y.o. G2P0 at [redacted]w[redacted]d by  02/02/2022, by Ultrasound presenting for routine prenatal visit  Plan   pregnancy 2 Problems  (from 06/04/21 to present)    Problem Noted Resolved   Supervision of normal pregnancy 06/04/2021 by Malachy Mood, MD No   Overview Addendum 01/10/2022 10:03 AM by Imagene Riches, CNM     Nursing Staff Provider  Office Location  [facility] Dating  7 wk u/s  Language  English Anatomy US  Normal, complete  Flu Vaccine  Declines Genetic Screen  NIPS: declines  TDaP vaccine    Hgb A1C or  GTT Early : n/a Third trimester : 102  Covid Unvaccinated [place]  Not needed Blood Type O/Positive/-- (06/29 0916)   Feeding Plan Breast Antibody Negative (06/29 0916)  Contraception POP Rubella 5.97 (06/29 0916)  Circumcision  RPR Non Reactive (06/29 0916)   Pediatrician   HBsAg Negative (06/29 0916)   Support Person Conner- HIV Non Reactive (06/29 0916)  Prenatal Classes Discussed Varicella Immune    GBS  (For PCN allergy, check sensitivities) negative  BTL Consent no    VBAC Consent n/a Pap 2021- NIL HPV +, repeat postpartum          History of ectopic pregnancy  by Malachy Mood, MD No       Term labor symptoms and general obstetric precautions including but not limited to vaginal bleeding, contractions, leaking of fluid and fetal movement were reviewed in detail with the patient. Please refer to After Visit Summary for other counseling recommendations.  She would prefer going into labor spontaneously, but would consider an IOL if she reaches 40 weeks. Very constipated. Takes Colace every day, but has difficulty passing stool.  Return in about 1 week (around 01/20/2022) for return OB.  Imagene Riches, CNM  01/13/2022 3:43 PM

## 2022-01-20 ENCOUNTER — Other Ambulatory Visit: Payer: Self-pay

## 2022-01-20 ENCOUNTER — Ambulatory Visit (INDEPENDENT_AMBULATORY_CARE_PROVIDER_SITE_OTHER): Payer: BC Managed Care – PPO | Admitting: Obstetrics & Gynecology

## 2022-01-20 VITALS — BP 120/80 | Wt 190.0 lb

## 2022-01-20 DIAGNOSIS — Z3A38 38 weeks gestation of pregnancy: Secondary | ICD-10-CM

## 2022-01-20 DIAGNOSIS — Z3483 Encounter for supervision of other normal pregnancy, third trimester: Secondary | ICD-10-CM

## 2022-01-20 LAB — POCT URINALYSIS DIPSTICK OB
Glucose, UA: NEGATIVE
POC,PROTEIN,UA: NEGATIVE

## 2022-01-20 NOTE — Patient Instructions (Signed)
First Stage of Labor ?Labor is your body's natural process of moving your baby and other structures, including the placenta and umbilical cord, out of your uterus. There are three stages of labor. How long each stage lasts is different for every person. However, certain events happen during each stage that are the same for everyone. ?The first stage starts when true labor begins. This stage ends when your cervix, which is the opening from your uterus into your vagina, is completely open (dilated). Once your cervix is fully dilated you will be ready to enter the second stage of labor and start pushing. ?First stage of labor ?The first stage of labor begins when you start having regular painful contractions that come at evenly spaced intervals. During this stage, your cervix starts to get thinner and opens in preparation for your baby to pass through. Health care providers measure the dilation of your cervix in centimeters (cm). One centimeter is a little less than one-half of an inch. The first stage ends when your cervix is dilated to 10 cm. The first stage of labor is divided into three phases: ?Early phase. ?Active phase. ?Transitional phase. ?The length of the first stage of labor varies. It may be longer if this is your first pregnancy. You may spend some of this stage at home trying to relax and stay comfortable. ?How does this affect me? ?During the first stage of labor, you will move through three phases. ?What happens in the early phase? ?You will start to have regular contractions that last 30-60 seconds. Contractions may come every 5-20 minutes. Keep track of your contractions and call your health care provider. ?Your water may break (rupture of membranes). This is when the sac of fluid that surrounds your baby breaks. ?You may notice a clear or slightly bloody discharge of mucus (mucus plug) from your vagina. ?What happens in the active phase? ?The active phase usually lasts 3-5 hours. You may go to the  hospital or birth center around this time. During the active phase: ?Your contractions will become stronger, longer, and more uncomfortable. ?Your contractions may last 45-90 seconds and come every 3-5 minutes. ?Your contractions will be painful. Most people feel abdominal pain with contractions, but you may also feel lower back pain. ?What happens in the transitional phase? ?The transitional phase typically lasts from 30 minutes to 2 hours. At the end of this phase, your cervix will be fully dilated to 10 cm. During the transitional phase: ?Contractions will get stronger, longer, and more intense. ?Contractions may last 60-90 seconds and come less than 2 minutes apart. ?You may feel hot flashes, chills, or nausea. ?How does this affect my baby? ?During the first stage of labor, your baby will gradually move down into your birth canal. ?Follow these instructions at home: ? ?When labor first begins, try to stay calm. You are still in the early phase. During this time it's important to rest and stay hydrated. You may want to make some calls and get ready to go to the hospital or birth center. ?When you are in the early phase, try these methods to help ease discomfort: ?Deep breathing and muscle relaxation. ?Taking a walk. ?Taking a warm bath or shower. ?Drink some fluids and have a light snack if you feel like it. ?Keep track of your contractions. ?Based on the plan you created with your health care provider, call when your contractions indicate it is time. ?If your water breaks, note the time, color, and odor of the fluid. ?  Contact a health care provider if: ?Your contractions are strong and regular. ?You have lower back pain or cramping. ?Your water breaks. ?You lose your mucus plug. ?Get help right away if: ?You have a severe headache that does not go away. ?You have changes in your vision. ?You have severe pain in your upper belly. ?You do not feel the baby move. ?You have bright red bleeding. ?Summary ?The first  stage of labor starts when true labor begins, and it ends when your cervix is dilated to 10 cm. ?The first stage of labor has three phases: early, active, and transitional. ?Your baby moves into the birth canal during the first stage of labor. ?Call your health care provider if you think your labor is starting. ?This information is not intended to replace advice given to you by your health care provider. Make sure you discuss any questions you have with your health care provider. ?Document Revised: 04/08/2021 Document Reviewed: 04/08/2021 ?Elsevier Patient Education ? 2022 [employer] ? ?

## 2022-01-20 NOTE — Progress Notes (Signed)
°  Subjective  Fetal Movement? yes Contractions? no Leaking Fluid? no Vaginal Bleeding? no  Objective  BP 120/80    Wt 190 lb (86.2 kg)    LMP 04/17/2021 (Exact Date)    BMI 33.66 kg/m  General: NAD Pumonary: no increased work of breathing Abdomen: gravid, non-tender Extremities: no edema Psychiatric: mood appropriate, affect full  Assessment  26 y.o. G2P0 at [redacted]w[redacted]d by  02/02/2022, by Ultrasound presenting for routine prenatal visit  Plan   Problem List Items Addressed This Visit      Other   Supervision of normal pregnancy - Primary   Relevant Orders   POC Urinalysis Dipstick OB (Completed)  Other Visit Diagnoses    [redacted] weeks gestation of pregnancy        PNV, FMC, Labor precautions Discussion options for labor, IOL  pregnancy 2 Problems (from 06/04/21 to present)    Problem Noted Resolved   Supervision of normal pregnancy 06/04/2021 by Vena Austria, MD No   Overview Addendum 01/10/2022 10:03 AM by Mirna Mires, CNM     Nursing Staff Provider  Office Location  [facility] Dating  7 wk u/s  Language  English Anatomy US  Normal, complete  Flu Vaccine  Declines Genetic Screen  NIPS: declines  TDaP vaccine    Hgb A1C or  GTT Early : n/a Third trimester : 102  Covid Unvaccinated Covid-2020   LAB RESULTS   Rhogam  Not needed Blood Type O/Positive/-- (06/29 0916)   Feeding Plan Breast Antibody Negative (06/29 0916)  Contraception POP Rubella 5.97 (06/29 0916)  Circumcision  RPR Non Reactive (06/29 0916)   Pediatrician   HBsAg Negative (06/29 0916)   Support Person Conner- HIV Non Reactive (06/29 0916)  Prenatal Classes Discussed Varicella Immune    GBS  (For PCN allergy, check sensitivities) negative  BTL Consent no    VBAC Consent n/a Pap 2021- NIL HPV +, repeat postpartum          History of ectopic pregnancy  by Vena Austria, MD No       Annamarie Major, MD, [facility], [facility] 01/20/2022  3:54 PM

## 2022-01-27 ENCOUNTER — Other Ambulatory Visit: Payer: Self-pay

## 2022-01-27 ENCOUNTER — Ambulatory Visit (INDEPENDENT_AMBULATORY_CARE_PROVIDER_SITE_OTHER): Payer: BC Managed Care – PPO | Admitting: Obstetrics

## 2022-01-27 VITALS — BP 122/72 | Wt 190.0 lb

## 2022-01-27 DIAGNOSIS — Z3A39 39 weeks gestation of pregnancy: Secondary | ICD-10-CM

## 2022-01-27 DIAGNOSIS — Z3403 Encounter for supervision of normal first pregnancy, third trimester: Secondary | ICD-10-CM

## 2022-01-27 NOTE — Progress Notes (Signed)
Routine Prenatal Care Visit  Subjective  Kathryn Wong is a 26 y.o. G2P0 at [redacted]w[redacted]d being seen today for ongoing prenatal care.  She is currently monitored for the following issues for this low-risk pregnancy and has Intractable migraine without aura and without status migrainosus; History of ectopic pregnancy; Cervical dysplasia; Supervision of normal pregnancy; Amniotic fluid leaking; Vaginal discharge; and Braxton Hick's contraction on their problem list.  ----------------------------------------------------------------------------------- Patient reports no complaints.  She would like a cervical sweep. Contractions: Irritability. Vag. Bleeding: None.  Movement: Present. Leaking Fluid denies.  ----------------------------------------------------------------------------------- The following portions of the patient's history were reviewed and updated as appropriate: allergies, current medications, past family history, past medical history, past social history, past surgical history and problem list. Problem list updated.  Objective  Blood pressure 122/72, weight 190 lb (86.2 kg), last menstrual period 04/17/2021, unknown if currently breastfeeding. Pregravid weight 155 lb (70.3 kg) Total Weight Gain 35 lb (15.9 kg) Urinalysis: Urine Protein    Urine Glucose    Fetal Status:     Movement: Present     General:  Alert, oriented and cooperative. Patient is in no acute distress.  Skin: Skin is warm and dry. No rash noted.   Cardiovascular: Normal heart rate noted  Respiratory: Normal respiratory effort, no problems with respiration noted  Abdomen: Soft, gravid, appropriate for gestational age. Pain/Pressure: Present     Pelvic:  examined: 3 cms/70%/-3. cervical sweep performed        Extremities: Normal range of motion.     Mental Status: Normal mood and affect. Normal behavior. Normal judgment and thought content.   Assessment   26 y.o. G2P0 at [redacted]w[redacted]d by  02/02/2022, by Ultrasound presenting for  routine prenatal visit  Plan   pregnancy 2 Problems (from 06/04/21 to present)    Problem Noted Resolved   Supervision of normal pregnancy 06/04/2021 by Vena Austria, MD No   Overview Addendum 01/10/2022 10:03 AM by Mirna Mires, CNM     Nursing Staff Provider  Office Location  [facility] Dating  7 wk u/s  Language  English Anatomy US  Normal, complete  Flu Vaccine  Declines Genetic Screen  NIPS: declines  TDaP vaccine    Hgb A1C or  GTT Early : n/a Third trimester : 102  Covid Unvaccinated Covid-2020   LAB RESULTS   Rhogam  Not needed Blood Type O/Positive/-- (06/29 0916)   Feeding Plan Breast Antibody Negative (06/29 0916)  Contraception POP Rubella 5.97 (06/29 0916)  Circumcision  RPR Non Reactive (06/29 0916)   Pediatrician   HBsAg Negative (06/29 0916)   Support Person Conner- HIV Non Reactive (06/29 0916)  Prenatal Classes Discussed Varicella Immune    GBS  (For PCN allergy, check sensitivities) negative  BTL Consent no    VBAC Consent n/a Pap 2021- NIL HPV +, repeat postpartum          History of ectopic pregnancy  by Vena Austria, MD No       Term labor symptoms and general obstetric precautions including but not limited to vaginal bleeding, contractions, leaking of fluid and fetal movement were reviewed in detail with the patient. Please refer to After Visit Summary for other counseling recommendations.  Labor precautions provided. She will RTC early next week if not delivered.  Return in about 1 week (around 02/03/2022) for return OB.  Mirna Mires, CNM  01/27/2022 11:54 AM

## 2022-01-31 ENCOUNTER — Observation Stay
Admission: EM | Admit: 2022-01-31 | Discharge: 2022-01-31 | Disposition: A | Discharge status: Home / Self Care | Payer: BC Managed Care – PPO | Attending: Obstetrics and Gynecology | Admitting: Obstetrics and Gynecology

## 2022-01-31 ENCOUNTER — Encounter: Payer: Self-pay | Admitting: Obstetrics and Gynecology

## 2022-01-31 ENCOUNTER — Other Ambulatory Visit: Payer: Self-pay

## 2022-01-31 DIAGNOSIS — O471 False labor at or after 37 completed weeks of gestation: Secondary | ICD-10-CM | POA: Diagnosis not present

## 2022-01-31 DIAGNOSIS — O26893 Other specified pregnancy related conditions, third trimester: Secondary | ICD-10-CM

## 2022-01-31 DIAGNOSIS — Z3403 Encounter for supervision of normal first pregnancy, third trimester: Secondary | ICD-10-CM

## 2022-01-31 DIAGNOSIS — O418X30 Other specified disorders of amniotic fluid and membranes, third trimester, not applicable or unspecified: Principal | ICD-10-CM | POA: Insufficient documentation

## 2022-01-31 DIAGNOSIS — Z3A39 39 weeks gestation of pregnancy: Secondary | ICD-10-CM | POA: Diagnosis not present

## 2022-01-31 DIAGNOSIS — R109 Unspecified abdominal pain: Secondary | ICD-10-CM | POA: Diagnosis not present

## 2022-01-31 DIAGNOSIS — Z8759 Personal history of other complications of pregnancy, childbirth and the puerperium: Secondary | ICD-10-CM

## 2022-01-31 LAB — RUPTURE OF MEMBRANE (ROM)PLUS: Rom Plus: NEGATIVE

## 2022-01-31 NOTE — OB Triage Note (Signed)
Patient is a 26 yo, G2P0, at 39 weeks 5 days. Patient presents with complaints of LOF that started at approximately 2030 today. Patient denies any vaginal bleeding. Patient reports +FM. Patient states she has been having braxton hicks contractions all day and rates them 0/10 on a pain scale. Monitors applied and assessing. VSS. Initial fetal heart tone 135. Will continue to monitor.

## 2022-01-31 NOTE — Progress Notes (Signed)
Discharge instructions provided to patient. Patient verbalized understanding. Pt educated on signs and symptoms of labor, vaginal bleeding, LOF, and fetal movement. Red flag signs reviewed by RN. Patient discharged home with significant other in stable condition.  

## 2022-01-31 NOTE — Discharge Summary (Signed)
Physician Final Progress Note  Patient ID: Kathryn Wong MRN: [id number] DOB/AGE: 1996/09/02 26 y.o.  Admit date: 01/31/2022 Admitting provider: Rod Can, CNM Discharge date: 01/31/2022   Admission Diagnoses:  1) intrauterine pregnancy at [redacted]w[redacted]d  2) leakage of fluid  Discharge Diagnoses:  Principal Problem:   Labor and delivery, indication for care Active Problems:   [redacted] weeks gestation of pregnancy    History of Present Illness: The patient is a 26 y.o. female G2P0 at [redacted]w[redacted]d who presents for complaint of fluid leaking this evening. She denies vaginal bleeding. She reports irregular contractions and good fetal movement.  Past Medical History:  Diagnosis Date   Ectopic pregnancy 12/24/2020   Migraine     Past Surgical History:  Procedure Laterality Date   WISDOM TOOTH EXTRACTION      No current facility-administered medications on file prior to encounter.   Current Outpatient Medications on File Prior to Encounter  Medication Sig Dispense Refill   Prenatal Vit-Fe Fumarate-FA (PRENATAL MULTIVITAMIN) TABS tablet Take 1 tablet by mouth daily at 12 noon.     Cholecalciferol (PA VITAMIN D-3 GUMMY PO) Take by mouth. (Patient not taking: Reported on 01/20/2022)     ondansetron (ZOFRAN-ODT) 4 MG disintegrating tablet Take 4 mg by mouth every 6 (six) hours as needed. (Patient not taking: Reported on 01/20/2022)      No Known Allergies  Social History   Socioeconomic History   Marital status: Married    Spouse name: Company secretary   Number of children: Not on file   Years of education: Not on file   Highest education level: Not on file  Occupational History   Not on file  Tobacco Use   Smoking status: Never   Smokeless tobacco: Never  Vaping Use   Vaping Use: Never used  Substance and Sexual Activity   Alcohol use: Yes    Comment: socially   Drug use: Never   Sexual activity: Yes    Birth control/protection: None  Other Topics Concern   Not on file  Social  History Narrative   Not on file   Social Determinants of Health   Financial Resource Strain: Not on file  Food Insecurity: Not on file  Transportation Needs: Not on file  Physical Activity: Not on file  Stress: Not on file  Social Connections: Not on file  Intimate Partner Violence: Not on file    Family History  Problem Relation Age of Onset   Healthy Mother    Healthy Father      Review of Systems  Constitutional:  Negative for chills and fever.  HENT:  Negative for congestion, ear discharge, ear pain, hearing loss, sinus pain and sore throat.   Eyes:  Negative for blurred vision and double vision.  Respiratory:  Negative for cough, shortness of breath and wheezing.   Cardiovascular:  Negative for chest pain, palpitations and leg swelling.  Gastrointestinal:  Positive for abdominal pain. Negative for blood in stool, constipation, diarrhea, heartburn, melena, nausea and vomiting.  Genitourinary:  Negative for dysuria, flank pain, frequency, hematuria and urgency.       Positive for fluid leaking  Musculoskeletal:  Negative for back pain, joint pain and myalgias.  Skin:  Negative for itching and rash.  Neurological:  Negative for dizziness, tingling, tremors, sensory change, speech change, focal weakness, seizures, loss of consciousness, weakness and headaches.  Endo/Heme/Allergies:  Negative for environmental allergies. Does not bruise/bleed easily.  Psychiatric/Behavioral:  Negative for depression, hallucinations, memory loss, substance abuse and suicidal  ideas. The patient is not nervous/anxious and does not have insomnia.     Physical Exam: BP 127/77 (BP Location: Left Arm)    Pulse (!) 101    Temp 98.6 F (37 C) (Axillary)    Resp 18    LMP 04/17/2021 (Exact Date)   Constitutional: Well nourished, well developed female in no acute distress.  HEENT: normal Skin: Warm and dry.  Cardiovascular: Regular rate and rhythm.   Respiratory: Clear to auscultation bilateral. Normal  respiratory effort Abdomen: FHT present  Pelvic exam: per RN K. Greeson 3/70/-3, same as exam at recent office visit Toco: every 2-10 minutes  Fetal well being: 135 bpm, moderate variability, +accelerations, -decelerations   Consults: None  Significant Findings/ Diagnostic Studies: labs:   01/31/22 22:19  Rom Plus NEGATIVE    Procedures: NST  Hospital Course: The patient was admitted to Labor and Delivery Triage for observation.   Discharge Condition: good  Disposition: Discharge disposition: 01-Home or Self Care  Diet: Regular diet  Discharge Activity: Activity as tolerated  Discharge Instructions     Discharge activity:  No Restrictions   Complete by: As directed    Discharge diet:  No restrictions   Complete by: As directed    LABOR:  When conractions begin, you should start to time them from the beginning of one contraction to the beginning  of the next.  When contractions are 5 - 10 minutes apart or less and have been regular for at least an hour, you should call your health care provider.   Complete by: As directed    No sexual activity restrictions   Complete by: As directed    Notify physician for bleeding from the vagina   Complete by: As directed    Notify physician for blurring of vision or spots before the eyes   Complete by: As directed    Notify physician for chills or fever   Complete by: As directed    Notify physician for fainting spells, "black outs" or loss of consciousness   Complete by: As directed    Notify physician for increase in vaginal discharge   Complete by: As directed    Notify physician for leaking of fluid   Complete by: As directed    Notify physician for pain or burning when urinating   Complete by: As directed    Notify physician for pelvic pressure (sudden increase)   Complete by: As directed    Notify physician for severe or continued nausea or vomiting   Complete by: As directed    Notify physician for sudden gushing of  fluid from the vagina (with or without continued leaking)   Complete by: As directed    Notify physician for sudden, constant, or occasional abdominal pain   Complete by: As directed    Notify physician if baby moving less than usual   Complete by: As directed       Allergies as of 01/31/2022   No Known Allergies      Medication List     STOP taking these medications    ondansetron 4 MG disintegrating tablet Commonly known as: ZOFRAN-ODT   PA VITAMIN D-3 GUMMY PO       TAKE these medications    prenatal multivitamin Tabs tablet Take 1 tablet by mouth daily at 12 noon.         Total time spent taking care of this patient: 17 minutes  Signed: Rod Can, CNM  01/31/2022, 11:06 PM

## 2022-02-02 ENCOUNTER — Other Ambulatory Visit: Payer: Self-pay

## 2022-02-02 ENCOUNTER — Encounter: Payer: Self-pay | Admitting: Obstetrics

## 2022-02-02 ENCOUNTER — Ambulatory Visit (INDEPENDENT_AMBULATORY_CARE_PROVIDER_SITE_OTHER): Payer: BC Managed Care – PPO | Admitting: Obstetrics

## 2022-02-02 VITALS — BP 120/70 | Wt 192.0 lb

## 2022-02-02 DIAGNOSIS — Z3403 Encounter for supervision of normal first pregnancy, third trimester: Secondary | ICD-10-CM

## 2022-02-02 DIAGNOSIS — Z3A40 40 weeks gestation of pregnancy: Secondary | ICD-10-CM

## 2022-02-02 NOTE — Progress Notes (Signed)
Routine Prenatal Care Visit  Subjective  Kathryn Wong is a 26 y.o. G2P0 at [redacted]w[redacted]d being seen today for ongoing prenatal care.  She is currently monitored for the following issues for this low-risk pregnancy and has Intractable migraine without aura and without status migrainosus; History of ectopic pregnancy; Cervical dysplasia; Supervision of normal pregnancy; Amniotic fluid leaking; Vaginal discharge; Braxton Hick's contraction; Labor and delivery, indication for care; and [redacted] weeks gestation of pregnancy on their problem list.  ----------------------------------------------------------------------------------- Patient reports occasional contractions and she is VERY ready for labor. Asking about IOL. she was traiged and sent home from the hospital a few days ago..   Contractions: Irritability. Vag. Bleeding: None.  Movement: Present. Leaking Fluid denies.  ----------------------------------------------------------------------------------- The following portions of the patient's history were reviewed and updated as appropriate: allergies, current medications, past family history, past medical history, past social history, past surgical history and problem list. Problem list updated.  Objective  Blood pressure 120/70, weight 192 lb (87.1 kg), last menstrual period 04/17/2021, unknown if currently breastfeeding. Pregravid weight 155 lb (70.3 kg) Total Weight Gain 37 lb (16.8 kg) Urinalysis: Urine Protein    Urine Glucose    Fetal Status:     Movement: Present     General:  Alert, oriented and cooperative. Patient is in no acute distress.  Skin: Skin is warm and dry. No rash noted.   Cardiovascular: Normal heart rate noted  Respiratory: Normal respiratory effort, no problems with respiration noted  Abdomen: Soft, gravid, appropriate for gestational age. Pain/Pressure: Present     Pelvic:  3cms/70/-3, soft, swept today        Extremities: Normal range of motion.     Mental Status: Normal mood  and affect. Normal behavior. Normal judgment and thought content.   Assessment   26 y.o. G2P0 at [redacted]w[redacted]d by  02/02/2022, by Ultrasound presenting for routine prenatal visit  Plan   pregnancy 2 Problems (from 06/04/21 to present)    Problem Noted Resolved   [redacted] weeks gestation of pregnancy 01/31/2022 by Tresea Mall, CNM No   Supervision of normal pregnancy 06/04/2021 by Vena Austria, MD No   Overview Addendum 01/10/2022 10:03 AM by Mirna Mires, CNM     Nursing Staff Provider  Office Location  [facility] Dating  7 wk u/s  Language  English Anatomy US  Normal, complete  Flu Vaccine  Declines Genetic Screen  NIPS: declines  TDaP vaccine    Hgb A1C or  GTT Early : n/a Third trimester : 102  Covid Unvaccinated Covid-2020   LAB RESULTS   Rhogam  Not needed Blood Type O/Positive/-- (06/29 0916)   Feeding Plan Breast Antibody Negative (06/29 0916)  Contraception POP Rubella 5.97 (06/29 0916)  Circumcision  RPR Non Reactive (06/29 0916)   Pediatrician   HBsAg Negative (06/29 0916)   Support Person Conner- HIV Non Reactive (06/29 0916)  Prenatal Classes Discussed Varicella Immune    GBS  (For PCN allergy, check sensitivities) negative  BTL Consent no    VBAC Consent n/a Pap 2021- NIL HPV +, repeat postpartum          History of ectopic pregnancy  by Vena Austria, MD No       Term labor symptoms and general obstetric precautions including but not limited to vaginal bleeding, contractions, leaking of fluid and fetal movement were reviewed in detail with the patient. Please refer to After Visit Summary for other counseling recommendations.  Encouraged that she is three cms dilated and will likely  go into labor soon. IOL set up for 02/11/2022. I anticipate she will labor before then. Orders not placed as she will be over 41 weks on 3/8- will labor spontaneously ,I expect.  Return if symptoms worsen or fail to improve.  Mirna Mires, CNM  02/02/2022 8:38 AM

## 2022-02-03 ENCOUNTER — Encounter: Payer: BC Managed Care – PPO | Admitting: Obstetrics

## 2022-02-06 ENCOUNTER — Encounter: Payer: Self-pay | Admitting: Obstetrics

## 2022-02-09 ENCOUNTER — Encounter: Payer: Self-pay | Admitting: Obstetrics & Gynecology

## 2022-02-09 ENCOUNTER — Other Ambulatory Visit: Payer: Self-pay

## 2022-02-09 ENCOUNTER — Encounter: Payer: BC Managed Care – PPO | Admitting: Advanced Practice Midwife

## 2022-02-09 ENCOUNTER — Inpatient Hospital Stay
Admission: EM | Admit: 2022-02-09 | Discharge: 2022-02-11 | DRG: 806 | Disposition: A | Discharge status: Home / Self Care | Payer: BC Managed Care – PPO | Attending: Obstetrics & Gynecology | Admitting: Obstetrics & Gynecology

## 2022-02-09 ENCOUNTER — Inpatient Hospital Stay: Payer: BC Managed Care – PPO | Admitting: Anesthesiology

## 2022-02-09 DIAGNOSIS — O480 Post-term pregnancy: Secondary | ICD-10-CM | POA: Diagnosis present

## 2022-02-09 DIAGNOSIS — Z3A41 41 weeks gestation of pregnancy: Secondary | ICD-10-CM

## 2022-02-09 DIAGNOSIS — Z20822 Contact with and (suspected) exposure to covid-19: Secondary | ICD-10-CM | POA: Diagnosis present

## 2022-02-09 DIAGNOSIS — O479 False labor, unspecified: Secondary | ICD-10-CM | POA: Diagnosis present

## 2022-02-09 DIAGNOSIS — O9081 Anemia of the puerperium: Secondary | ICD-10-CM | POA: Diagnosis not present

## 2022-02-09 DIAGNOSIS — D62 Acute posthemorrhagic anemia: Secondary | ICD-10-CM | POA: Diagnosis not present

## 2022-02-09 LAB — CBC
HCT: 36.1 % (ref 36.0–46.0)
Hemoglobin: 12.2 g/dL (ref 12.0–15.0)
MCH: 30.6 pg (ref 26.0–34.0)
MCHC: 33.8 g/dL (ref 30.0–36.0)
MCV: 90.5 fL (ref 80.0–100.0)
Platelets: 266 10*3/uL (ref 150–400)
RBC: 3.99 MIL/uL (ref 3.87–5.11)
RDW: 14 % (ref 11.5–15.5)
WBC: 12.5 10*3/uL — ABNORMAL HIGH (ref 4.0–10.5)
nRBC: 0 % (ref 0.0–0.2)

## 2022-02-09 LAB — RESP PANEL BY RT-PCR (FLU A&B, COVID) ARPGX2
Influenza A by PCR: NEGATIVE
Influenza B by PCR: NEGATIVE
SARS Coronavirus 2 by RT PCR: NEGATIVE

## 2022-02-09 LAB — PROTEIN / CREATININE RATIO, URINE
Creatinine, Urine: 40 mg/dL
Protein Creatinine Ratio: 0.2 mg/mg{Cre} — ABNORMAL HIGH (ref 0.00–0.15)
Total Protein, Urine: 8 mg/dL

## 2022-02-09 LAB — COMPREHENSIVE METABOLIC PANEL
ALT: 11 U/L (ref 0–44)
AST: 20 U/L (ref 15–41)
Albumin: 3 g/dL — ABNORMAL LOW (ref 3.5–5.0)
Alkaline Phosphatase: 164 U/L — ABNORMAL HIGH (ref 38–126)
Anion gap: 8 (ref 5–15)
BUN: 8 mg/dL (ref 6–20)
CO2: 20 mmol/L — ABNORMAL LOW (ref 22–32)
Calcium: 9.5 mg/dL (ref 8.9–10.3)
Chloride: 108 mmol/L (ref 98–111)
Creatinine, Ser: 0.57 mg/dL (ref 0.44–1.00)
GFR, Estimated: >60 mL/min (ref >60)
Glucose, Bld: 74 mg/dL (ref 70–99)
Potassium: 4.1 mmol/L (ref 3.5–5.1)
Sodium: 136 mmol/L (ref 135–145)
Total Bilirubin: 0.8 mg/dL (ref 0.3–1.2)
Total Protein: 6.3 g/dL — ABNORMAL LOW (ref 6.5–8.1)

## 2022-02-09 MED ORDER — LIDOCAINE-EPINEPHRINE (PF) 1.5 %-1:200000 IJ SOLN
INTRAMUSCULAR | Status: DC | PRN
Start: 2022-02-09 — End: 2022-02-10
  Administered 2022-02-09: 5 mL via PERINEURAL

## 2022-02-09 MED ORDER — EPHEDRINE 5 MG/ML INJ: 10.0000 mg | INTRAVENOUS | Status: DC | PRN

## 2022-02-09 MED ORDER — LIDOCAINE HCL (PF) 1 % IJ SOLN
INTRAMUSCULAR | Status: AC
  Filled 2022-02-09: qty 30

## 2022-02-09 MED ORDER — BUPIVACAINE HCL (PF) 0.25 % IJ SOLN
INTRAMUSCULAR | Status: DC | PRN
Start: 2022-02-09 — End: 2022-02-10
  Administered 2022-02-09: 4 mL via EPIDURAL
  Administered 2022-02-09: 2 mL via EPIDURAL

## 2022-02-09 MED ORDER — PHENYLEPHRINE 40 MCG/ML (10ML) SYRINGE FOR IV PUSH (FOR BLOOD PRESSURE SUPPORT): 80.0000 ug | PREFILLED_SYRINGE | INTRAVENOUS | Status: DC | PRN

## 2022-02-09 MED ORDER — ONDANSETRON HCL 4 MG/2ML IJ SOLN
4.0000 mg | Freq: Four times a day (QID) | INTRAMUSCULAR | Status: DC | PRN
  Administered 2022-02-10: 4 mg via INTRAVENOUS
  Filled 2022-02-09: qty 2

## 2022-02-09 MED ORDER — LIDOCAINE HCL (PF) 1 % IJ SOLN
INTRAMUSCULAR | Status: DC | PRN
  Administered 2022-02-09: 3 mL

## 2022-02-09 MED ORDER — OXYTOCIN-SODIUM CHLORIDE 30-0.9 UT/500ML-% IV SOLN
2.5000 [IU]/h | INTRAVENOUS | Status: DC
  Administered 2022-02-10: 2.5 [IU]/h via INTRAVENOUS

## 2022-02-09 MED ORDER — SOD CITRATE-CITRIC ACID 500-334 MG/5ML PO SOLN
30.0000 mL | ORAL | Status: DC | PRN
  Administered 2022-02-09: 30 mL via ORAL

## 2022-02-09 MED ORDER — OXYTOCIN BOLUS FROM INFUSION
333.0000 mL | Freq: Once | INTRAVENOUS | Status: AC
  Administered 2022-02-10: 333 mL via INTRAVENOUS

## 2022-02-09 MED ORDER — MISOPROSTOL 200 MCG PO TABS
ORAL_TABLET | ORAL | Status: AC
  Filled 2022-02-09: qty 4

## 2022-02-09 MED ORDER — LACTATED RINGERS IV SOLN
500.0000 mL | INTRAVENOUS | Status: DC | PRN
  Administered 2022-02-09: 500 mL via INTRAVENOUS

## 2022-02-09 MED ORDER — LIDOCAINE HCL (PF) 1 % IJ SOLN
30.0000 mL | INTRAMUSCULAR | Status: DC | PRN
  Filled 2022-02-09: qty 30

## 2022-02-09 MED ORDER — OXYCODONE-ACETAMINOPHEN 5-325 MG PO TABS: 1.0000 | ORAL_TABLET | ORAL | Status: DC | PRN

## 2022-02-09 MED ORDER — ACETAMINOPHEN 325 MG PO TABS: 650.0000 mg | ORAL_TABLET | ORAL | Status: DC | PRN

## 2022-02-09 MED ORDER — DIPHENHYDRAMINE HCL 50 MG/ML IJ SOLN: 12.5000 mg | INTRAMUSCULAR | Status: DC | PRN

## 2022-02-09 MED ORDER — FENTANYL-BUPIVACAINE-NACL 0.5-0.125-0.9 MG/250ML-% EP SOLN
12.0000 mL/h | EPIDURAL | Status: DC | PRN
  Administered 2022-02-09: 12 mL/h via EPIDURAL

## 2022-02-09 MED ORDER — LACTATED RINGERS IV SOLN: 500.0000 mL | Freq: Once | INTRAVENOUS | Status: DC

## 2022-02-09 MED ORDER — LACTATED RINGERS IV SOLN: INTRAVENOUS | Status: DC

## 2022-02-09 MED ORDER — FENTANYL-BUPIVACAINE-NACL 0.5-0.125-0.9 MG/250ML-% EP SOLN
EPIDURAL | Status: AC
  Filled 2022-02-09: qty 250

## 2022-02-09 MED ORDER — TERBUTALINE SULFATE 1 MG/ML IJ SOLN
0.2500 mg | Freq: Once | INTRAMUSCULAR | Status: DC | PRN
Start: 2022-02-09 — End: 2022-02-10

## 2022-02-09 MED ORDER — OXYTOCIN-SODIUM CHLORIDE 30-0.9 UT/500ML-% IV SOLN
1.0000 m[IU]/min | INTRAVENOUS | Status: DC
  Administered 2022-02-09: 2 m[IU]/min via INTRAVENOUS
  Filled 2022-02-09: qty 1000

## 2022-02-09 MED ORDER — OXYCODONE-ACETAMINOPHEN 5-325 MG PO TABS: 2.0000 | ORAL_TABLET | ORAL | Status: DC | PRN

## 2022-02-09 MED ORDER — OXYTOCIN 10 UNIT/ML IJ SOLN
INTRAMUSCULAR | Status: AC
  Filled 2022-02-09: qty 2

## 2022-02-09 MED ORDER — AMMONIA AROMATIC IN INHA
RESPIRATORY_TRACT | Status: AC
  Filled 2022-02-09: qty 10

## 2022-02-09 NOTE — Progress Notes (Signed)
Kathryn Wong is a 26 y.o. G2P0 at [redacted]w[redacted]d  who is now in the second stage of labor and has been pushing since 2145. ? ?Subjective:she has pushed in various positions, and has used the birthing bar and the "rope" technique. Has pushed about 2 hours and shares that she is feeling tired. ? ? ?Objective: ?BP 108/71   Pulse (!) 124   Temp 97.9 ?F (36.6 ?C) (Oral)   Resp 16   Ht 5\' 3"  (1.6 m)   Wt 87.1 kg   LMP 04/17/2021 (Exact Date)   SpO2 98%   BMI 34.01 kg/m?  ?No intake/output data recorded. ?No intake/output data recorded. ? ?FHT:  FHR: 120 bpm, variability: moderate,  accelerations:  Present,  decelerations:  Present some slow return to baseline after contractions  ?UC:   regular, every 2 minutes ?SVE:   Dilation: 10 ?Effacement (%): 100 ?Station: Plus 1 ?Exam by:: Rolly Salter CNM ? ?+ caput noted. ? ?Labs: ?Lab Results  ?Component Value Date  ? WBC 12.5 (H) 02/09/2022  ? HGB 12.2 02/09/2022  ? HCT 36.1 02/09/2022  ? MCV 90.5 02/09/2022  ? PLT 266 02/09/2022  ? ? ?Assessment / Plan: ?Second stage labor since 2130 with 2 hours of pushing ? ?Labor:  pitocin has been discontinued. ? ?Fetal Wellbeing:  Category II ?Pain Control:  Epidural ?I/D:  n/a ?Anticipated MOD:   has pushed for almost two hours and c/o maternal fatigue.Will consult with Dr. Kenton Kingfisher and evaluate for possible vacuum assist. ? ?Imagene Riches ?02/09/2022, 11:52 PM ? ? ?

## 2022-02-09 NOTE — Progress Notes (Signed)
Patient has been walking the unit and on the birthing ball in the room. Give patient "[employer]", and explained. Patient verbalizes understanding and will begin "[employer]" now. ?

## 2022-02-09 NOTE — H&P (Addendum)
Kathryn Wong is a 26 y.o. female presenting for a labor check. This is her thrid time coming to labor and Delivery for a check. She had called the unit and reported contractions every 5 minutes. She denies any bloody show or LOF. Her baby has been moving well. Her husband Kathryn Wong is with her and supoportive. ?OB History   ? ? Gravida  ?2  ? Para  ?   ? Term  ?   ? Preterm  ?   ? AB  ?   ? Living  ?   ?  ? ? SAB  ?   ? IAB  ?   ? Ectopic  ?   ? Multiple  ?   ? Live Births  ?   ?   ?  ?  ? ?Past Medical History:  ?Diagnosis Date  ? Ectopic pregnancy 12/24/2020  ? Migraine   ? ?Past Surgical History:  ?Procedure Laterality Date  ? WISDOM TOOTH EXTRACTION    ? ?Family History: family history includes Healthy in her father and mother. ?Social History:  reports that she has never smoked. She has never used smokeless tobacco. She reports that she does not currently use alcohol. She reports that she does not use drugs. ? ? ?  ?Maternal Diabetes: No ?Genetic Screening: Normal ?Maternal Ultrasounds/Referrals: Normal ?Fetal Ultrasounds or other Referrals:  None ?Maternal Substance Abuse:  No ?Significant Maternal Medications:  None ?Significant Maternal Lab Results:  Group B Strep negative ?Other Comments:  None ? ?Review of Systems ?History ?Dilation: 3.5 ?Effacement (%): 80 ?Station: -3 ?Exam by:: Paula Compton, CNM ? ?Bishop score 5 ? ?Blood pressure 135/86, pulse 90, temperature 98.4 ?F (36.9 ?C), temperature source Oral, resp. rate 16, height 5\' 3"  (1.6 m), weight 87.1 kg, last menstrual period 04/17/2021, unknown if currently breastfeeding. ?Maternal Exam:  ?Uterine Assessment: Contraction strength is mild.  Contraction frequency is irregular.  ?Abdomen: Fetal presentation: vertex ?Introitus: Normal vulva. Normal vagina.  Pelvis: adequate for delivery.   ?Cervix: Cervix evaluated by digital exam.   ? ?Physical Exam ?Constitutional:   ?   Appearance: Normal appearance. She is obese.  ?HENT:  ?   Head: Normocephalic and  atraumatic.  ?Cardiovascular:  ?   Rate and Rhythm: Normal rate and regular rhythm.  ?   Pulses: Normal pulses.  ?   Heart sounds: Normal heart sounds.  ?Pulmonary:  ?   Effort: Pulmonary effort is normal.  ?   Breath sounds: Normal breath sounds.  ?Abdominal:  ?   Comments: Gravid abdomen.  ?Genitourinary: ?   General: Normal vulva.  ?Musculoskeletal:  ?   Cervical back: Normal range of motion and neck supple.  ?Skin: ?   General: Skin is warm and dry.  ?Neurological:  ?   General: No focal deficit present.  ?   Mental Status: She is alert and oriented to person, place, and time.  ?Psychiatric:     ?   Mood and Affect: Mood normal.     ?   Behavior: Behavior normal.  ?  ?Prenatal labs: ?ABO, Rh: O/Positive/-- (06/29 [birth year]-1985) ?Antibody: Negative (06/29 0916) ?Rubella: 5.97 (06/29 03-09-1987) ?RPR: Non Reactive (11/29 1043)  ?HBsAg: Negative (06/29 0916)  ?HIV: Non Reactive (11/29 1043)  ?GBS: Negative/-- (01/31 1627)  ? ?Assessment/Plan: ?IUP 41 weeks ?G2P0 ?Category 1 FHTs ?Latent phase labor ?She plans an epidural ?Will admit for labor and augment with pitocin. ?Advised to walk for several hours. ?Routine admission labs, PIH panel ?IV access ?Continuous EFM. ?  Dr. Tiburcio Pea notified of admission . ?  ? ?Mirna Mires ?02/09/2022, 10:30 AM ? ? ? ? ?

## 2022-02-09 NOTE — Progress Notes (Signed)
Kathryn Wong is a 26 y.o. G2P0 at [redacted]w[redacted]d who continues with augmentation of labor. ? ?Subjective: Her contractions are regular, and they feel somewhat stronger. She is amenable to AROM ? ? ?Objective: ?BP 134/70 (BP Location: Right Arm)   Pulse (!) 106   Temp 98.3 ?F (36.8 ?C) (Oral)   Resp 18   Ht 5\' 3"  (1.6 m)   Wt 87.1 kg   LMP 04/17/2021 (Exact Date)   BMI 34.01 kg/m?  ?No intake/output data recorded. ?No intake/output data recorded. ? ?FHT:  FHR: 135 bpm, variability: moderate,  accelerations:  Present,  decelerations:  Absent ?UC:   regular, every 2-3 minutes ?SVE:   Dilation: 4 ?Effacement (%): 90 ?Station: -2 ?Exam by:: [email or web address], CNM ? ?Labs: ?Lab Results  ?Component Value Date  ? WBC 12.5 (H) 02/09/2022  ? HGB 12.2 02/09/2022  ? HCT 36.1 02/09/2022  ? MCV 90.5 02/09/2022  ? PLT 266 02/09/2022  ? ? ?Assessment / Plan: ?Induction of labor due to term with favorable cervix and postterm,  progressing well on pitocin ? ?Labor:  AROM performed- clear fluid seen ? ?Fetal Wellbeing:  Category I ?Pain Control:  Labor support without medications ?I/D:  n/a ?Anticipated MOD:  NSVD ? ?04/11/2022 ?02/09/2022, 5:14 PM ? ? ?

## 2022-02-09 NOTE — Progress Notes (Signed)
Kathryn Wong is a 26 y.o. G2P0 at [redacted]w[redacted]d now admitted  for augmentation of labor ? ?Subjective:she is up moving in the room. Feels that the contractions are increasing in strength and frequency. ? ? ?Objective: ?BP 139/77 (BP Location: Right Arm)   Pulse 80   Temp 98.1 ?F (36.7 ?C) (Oral)   Resp 18   Ht 5\' 3"  (1.6 m)   Wt 87.1 kg   LMP 04/17/2021 (Exact Date)   BMI 34.01 kg/m?  ?No intake/output data recorded. ?No intake/output data recorded. ? ?FHT:  FHR: 135 bpm, variability: moderate,  accelerations:  Present,  decelerations:  Absent ?UC:   regular, every 2-3 minutes ?SVE:   Dilation: 3.5 ?Effacement (%): 80 ?Station: -3 ?Exam by:: [email or web address], CNM ? ?Labs: ?Lab Results  ?Component Value Date  ? WBC 12.5 (H) 02/09/2022  ? HGB 12.2 02/09/2022  ? HCT 36.1 02/09/2022  ? MCV 90.5 02/09/2022  ? PLT 266 02/09/2022  ? ? ?Assessment / Plan: ?Induction of labor due to term with favorable cervix and postterm,  progressing well on pitocin ? ?Labor:  now being augmented with pitocin ? ?Fetal Wellbeing:  Category I ?Pain Control:  Labor support without medications ?I/D:  n/a ?Anticipated MOD:  NSVD ? ?04/11/2022 ?02/09/2022, 2:51 PM ? ? ?

## 2022-02-09 NOTE — Anesthesia Preprocedure Evaluation (Signed)
Anesthesia Evaluation  ?Patient identified by MRN, date of birth, ID band ?Patient awake ? ? ? ?Reviewed: ?Allergy & Precautions, H&P , NPO status , Patient's Chart, lab work & pertinent test results ? ?Airway ?Mallampati: II ? ? ? ? ? ? Dental ?no notable dental hx. ? ?  ?Pulmonary ?neg pulmonary ROS,  ?  ? ? ? ? ? ? ? Cardiovascular ?negative cardio ROS ? ? ? ? ?  ?Neuro/Psych ? Headaches, negative psych ROS  ? GI/Hepatic ?Neg liver ROS, GERD  ,  ?Endo/Other  ?negative endocrine ROS ? Renal/GU ?negative Renal ROS  ?negative genitourinary ?  ?Musculoskeletal ? ? Abdominal ?  ?Peds ? Hematology ?negative hematology ROS ?(+)   ?Anesthesia Other Findings ? ? Reproductive/Obstetrics ?(+) Pregnancy ? ?  ? ? ? ? ? ? ? ? ? ? ? ? ? ?  ?  ? ? ? ? ? ? ? ? ?Anesthesia Physical ?Anesthesia Plan ? ?ASA: 2 ? ?Anesthesia Plan: Epidural  ? ?Post-op Pain Management:   ? ?Induction:  ? ?PONV Risk Score and Plan:  ? ?Airway Management Planned:  ? ?Additional Equipment:  ? ?Intra-op Plan:  ? ?Post-operative Plan:  ? ?Informed Consent: I have reviewed the patients History and Physical, chart, labs and discussed the procedure including the risks, benefits and alternatives for the proposed anesthesia with the patient or authorized representative who has indicated his/her understanding and acceptance.  ? ? ? ?Dental Advisory Given ? ?Plan Discussed with: Anesthesiologist ? ?Anesthesia Plan Comments:   ? ? ? ? ? ? ?Anesthesia Quick Evaluation ? ?

## 2022-02-09 NOTE — OB Triage Note (Signed)
Patient is 109w0d, G2P0 that presents with complaints of ctx every 6 minutes at home. Patient states that contractions started last night and they were about 10 minutes apart. Patient states that her pain is mainly cramping but a 5/10 on a 0-10 pain scale. Patient denies any LOF or bleeding. Patient feels positive FM. VSS. Monitors applied and assessing.  ?

## 2022-02-09 NOTE — Progress Notes (Signed)
Kathryn Wong is a 26 y.o. G2P0 at [redacted]w[redacted]d who continues with labor augmentation ? ?Subjective:She is much more comfortable post epidural placement, but does rate her pain a 4-5 with contractions. ? ? ?Objective: ?BP 131/82   Pulse 90   Temp 98.1 ?F (36.7 ?C) (Oral)   Resp 18   Ht 5\' 3"  (1.6 m)   Wt 87.1 kg   LMP 04/17/2021 (Exact Date)   SpO2 99%   BMI 34.01 kg/m?  ?No intake/output data recorded. ?No intake/output data recorded. ? ?FHT:  FHR: 135 bpm, variability: moderate,  accelerations:  Present,  decelerations:  Present seferal brief decels noted with contractions post epidural placement,none at present ?UC:   regular, every 2.5 minutes ?SVE:   Dilation: 5.5 ?Effacement (%): 100 ?Station: -2 ?Exam by:: [email or web address], CNM ?Pitocin increased to 10 mu/min ? ?Labs: ?Lab Results  ?Component Value Date  ? WBC 12.5 (H) 02/09/2022  ? HGB 12.2 02/09/2022  ? HCT 36.1 02/09/2022  ? MCV 90.5 02/09/2022  ? PLT 266 02/09/2022  ? ? ?Assessment / Plan: ?Augmentation of labor, progressing well ? ?Labor: Progressing normally ?Fetal Wellbeing:  Category II ?Pain Control:  Epidural ?I/D:  n/a ?Anticipated MOD:  NSVD ? ?04/11/2022 ?02/09/2022, 6:58 PM ? ? ?

## 2022-02-09 NOTE — Anesthesia Procedure Notes (Signed)
Epidural ?Patient location during procedure: OB ?Start time: 02/09/2022 5:50 PM ?End time: 02/09/2022 6:00 PM ? ?Staffing ?Anesthesiologist: Corinda Gubler, MD ?Resident/CRNA: Elmarie Mainland, CRNA ?Performed: resident/CRNA  ? ?Preanesthetic Checklist ?Completed: patient identified, IV checked, site marked, risks and benefits discussed, surgical consent, monitors and equipment checked, pre-op evaluation and timeout performed ? ?Epidural ?Patient position: sitting ?Prep: ChloraPrep ?Patient monitoring: heart rate, continuous pulse ox and blood pressure ?Approach: midline ?Location: L3-L4 ?Injection technique: LOR saline ? ?Needle:  ?Needle type: Tuohy  ?Needle gauge: 17 G ?Needle length: 9 cm and 9 ?Needle insertion depth: 7 cm ?Catheter type: closed end flexible ?Catheter size: 19 Gauge ?Catheter at skin depth: 12 cm ?Test dose: negative and 1.5% lidocaine with Epi 1:200 K ? ?Assessment ?Sensory level: T10 ?Events: blood not aspirated, injection not painful, no injection resistance, no paresthesia and negative IV test ? ?Additional Notes ?1 attempt ?Pt. Evaluated and documentation done after procedure finished. ?Patient identified. Risks/Benefits/Options discussed with patient including but not limited to bleeding, infection, nerve damage, paralysis, failed block, incomplete pain control, headache, blood pressure changes, nausea, vomiting, reactions to medication both or allergic, itching and postpartum back pain. Confirmed with bedside nurse the patient's most recent platelet count. Confirmed with patient that they are not currently taking any anticoagulation, have any bleeding history or any family history of bleeding disorders. Patient expressed understanding and wished to proceed. All questions were answered. Sterile technique was used throughout the entire procedure. Please see nursing notes for vital signs. Test dose was given through epidural catheter and negative prior to continuing to dose epidural or start  infusion. Warning signs of high block given to the patient including shortness of breath, tingling/numbness in hands, complete motor block, or any concerning symptoms with instructions to call for help. Patient was given instructions on fall risk and not to get out of bed. All questions and concerns addressed with instructions to call with any issues or inadequate analgesia.   ? ?Patient tolerated the insertion well without immediate complications.Reason for block:procedure for pain ? ? ? ?

## 2022-02-10 ENCOUNTER — Encounter: Payer: Self-pay | Admitting: Obstetrics

## 2022-02-10 DIAGNOSIS — O480 Post-term pregnancy: Principal | ICD-10-CM

## 2022-02-10 DIAGNOSIS — Z3A41 41 weeks gestation of pregnancy: Secondary | ICD-10-CM

## 2022-02-10 LAB — CBC
HCT: 29.8 % — ABNORMAL LOW (ref 36.0–46.0)
Hemoglobin: 10.1 g/dL — ABNORMAL LOW (ref 12.0–15.0)
MCH: 30.6 pg (ref 26.0–34.0)
MCHC: 33.9 g/dL (ref 30.0–36.0)
MCV: 90.3 fL (ref 80.0–100.0)
Platelets: 230 10*3/uL (ref 150–400)
RBC: 3.3 MIL/uL — ABNORMAL LOW (ref 3.87–5.11)
RDW: 14.4 % (ref 11.5–15.5)
WBC: 19.7 10*3/uL — ABNORMAL HIGH (ref 4.0–10.5)
nRBC: 0 % (ref 0.0–0.2)

## 2022-02-10 LAB — RPR: RPR Ser Ql: NONREACTIVE

## 2022-02-10 MED ORDER — OXYCODONE HCL 5 MG PO TABS: 5.0000 mg | ORAL_TABLET | ORAL | Status: DC | PRN

## 2022-02-10 MED ORDER — SIMETHICONE 80 MG PO CHEW: 80.0000 mg | CHEWABLE_TABLET | ORAL | Status: DC | PRN

## 2022-02-10 MED ORDER — IBUPROFEN 600 MG PO TABS
600.0000 mg | ORAL_TABLET | Freq: Four times a day (QID) | ORAL | Status: DC
  Administered 2022-02-10 – 2022-02-11 (×6): 600 mg via ORAL
  Filled 2022-02-10 (×6): qty 1

## 2022-02-10 MED ORDER — ACETAMINOPHEN 325 MG PO TABS
650.0000 mg | ORAL_TABLET | ORAL | Status: DC | PRN
  Administered 2022-02-10 – 2022-02-11 (×4): 650 mg via ORAL
  Filled 2022-02-10 (×4): qty 2

## 2022-02-10 MED ORDER — WITCH HAZEL-GLYCERIN EX PADS
1.0000 "application " | MEDICATED_PAD | CUTANEOUS | Status: DC | PRN
  Administered 2022-02-10 – 2022-02-11 (×3): 1 via TOPICAL
  Filled 2022-02-10 (×3): qty 100

## 2022-02-10 MED ORDER — ONDANSETRON HCL 4 MG/2ML IJ SOLN: 4.0000 mg | INTRAMUSCULAR | Status: DC | PRN

## 2022-02-10 MED ORDER — ONDANSETRON HCL 4 MG PO TABS
4.0000 mg | ORAL_TABLET | ORAL | Status: DC | PRN
  Administered 2022-02-11: 4 mg via ORAL
  Filled 2022-02-10: qty 1

## 2022-02-10 MED ORDER — DIBUCAINE (PERIANAL) 1 % EX OINT
1.0000 "application " | TOPICAL_OINTMENT | CUTANEOUS | Status: DC | PRN
  Filled 2022-02-10 (×2): qty 28

## 2022-02-10 MED ORDER — DIPHENHYDRAMINE HCL 25 MG PO CAPS: 25.0000 mg | ORAL_CAPSULE | Freq: Four times a day (QID) | ORAL | Status: DC | PRN

## 2022-02-10 MED ORDER — BENZOCAINE-MENTHOL 20-0.5 % EX AERO
1.0000 "application " | INHALATION_SPRAY | CUTANEOUS | Status: DC | PRN
  Administered 2022-02-10 – 2022-02-11 (×2): 1 via TOPICAL
  Filled 2022-02-10 (×2): qty 56

## 2022-02-10 MED ORDER — COCONUT OIL OIL
1.0000 "application " | TOPICAL_OIL | Status: DC | PRN
  Administered 2022-02-10: 1 via TOPICAL
  Filled 2022-02-10: qty 120

## 2022-02-10 MED ORDER — OXYCODONE HCL 5 MG PO TABS: 10.0000 mg | ORAL_TABLET | ORAL | Status: DC | PRN

## 2022-02-10 MED ORDER — ZOLPIDEM TARTRATE 5 MG PO TABS: 5.0000 mg | ORAL_TABLET | Freq: Every evening | ORAL | Status: DC | PRN

## 2022-02-10 MED ORDER — DOCUSATE SODIUM 100 MG PO CAPS
100.0000 mg | ORAL_CAPSULE | Freq: Two times a day (BID) | ORAL | Status: DC
  Administered 2022-02-11: 100 mg via ORAL
  Filled 2022-02-10: qty 1

## 2022-02-10 MED ORDER — PRENATAL MULTIVITAMIN CH
1.0000 | ORAL_TABLET | Freq: Every day | ORAL | Status: DC
  Administered 2022-02-10: 1 via ORAL
  Filled 2022-02-10: qty 1

## 2022-02-10 MED ORDER — TETANUS-DIPHTH-ACELL PERTUSSIS 5-2.5-18.5 LF-MCG/0.5 IM SUSY
0.5000 mL | PREFILLED_SYRINGE | Freq: Once | INTRAMUSCULAR | Status: DC
  Filled 2022-02-10: qty 0.5

## 2022-02-10 NOTE — Anesthesia Postprocedure Evaluation (Signed)
Anesthesia Post Note ? ?Patient: Kathryn Wong ? ?Procedure(s) Performed: AN AD HOC LABOR EPIDURAL ? ?Patient location during evaluation: Mother Baby ?Anesthesia Type: Epidural ?Level of consciousness: awake, oriented and awake and alert ?Pain management: pain level controlled ?Vital Signs Assessment: post-procedure vital signs reviewed and stable ?Respiratory status: spontaneous breathing, respiratory function stable and nonlabored ventilation ?Cardiovascular status: blood pressure returned to baseline and stable ?Postop Assessment: no headache and no backache ?Anesthetic complications: no ? ? ?No notable events documented. ? ? ?Last Vitals:  ?Vitals:  ? 02/10/22 0430 02/10/22 0753  ?BP: 123/71 119/74  ?Pulse: 83 79  ?Resp: 19 18  ?Temp: 36.9 ?C 37.1 ?C  ?SpO2: 99% 97%  ?  ?Last Pain:  ?Vitals:  ? 02/10/22 0810  ?TempSrc:   ?PainSc: 0-No pain  ? ? ?  ?  ?  ?  ?  ?  ? ?Judeth Cornfield Camelia Stelzner ? ? ? ? ?

## 2022-02-10 NOTE — Lactation Note (Signed)
This note was copied from a baby's chart. ?Lactation Consultation Note ? ?Patient Name: Kathryn Wong ?Today's Date: 02/10/2022 ?Reason for consult: Follow-up assessment ?Age:28 hours ? ?Maternal Data ?  ?See previous note ?Feeding ? Baby awakened while doing skin to skin,  feeding cues recognized by mom. Assisted mom with positioning baby at left breast in football hold. Encouraged mom to hand express and roll nipple as baby was attempting to latch and detaching at the left breast. Mom did latch baby after hand expression. Baby fed with audible swallows. Mom positioned baby at right breast in cross cradle hold. Encouraged mom to support breast to assist baby with maintaining latch. Baby did sustain latch after a few latches and detachments and fed with audible swallows. Mom burped baby, wet burp noted.  ? ?LATCH Score ?Latch: Repeated attempts needed to sustain latch, nipple held in mouth throughout feeding, stimulation needed to elicit sucking reflex. (Encouraged mom to hand express and roll nipple to erect nipple to facilitate latch.) ? ?Audible Swallowing: Spontaneous and intermittent ? ?Type of Nipple: Everted at rest and after stimulation ? ?Comfort (Breast/Nipple): Soft / non-tender ? ?Hold (Positioning): Assistance needed to correctly position infant at breast and maintain latch. ? ?LATCH Score: 8 ? ? ?Lactation Tools Discussed/Used ?  ? ?Interventions ?Interventions: Assisted with latch;Hand express;Adjust position;Support pillows;Position options;Education Support and encouragement provided to first time mom. Recommended mom rest when baby is resting. ? ?Discharge ?  ? ?Consult Status ?Consult Status: Follow-up ?Follow-up type: In-patient ?Mom has LC number on board to call as needed. Will follow up with mom tomorrow per her request. ? ? ?Danford Bad ?02/10/2022, 3:08 PM ? ? ? ?

## 2022-02-10 NOTE — Lactation Note (Signed)
This note was copied from a baby's chart. ?Lactation Consultation Note ? ?Patient Name: Kathryn Wong ?Today's Date: 02/10/2022 ?Reason for consult: Initial assessment;Term;Primapara ?Age:28 hours ? ?Maternal Data ? First time breastfeeding mother. Mom reports she has been able to position and latch baby in cross cradle hold. Mom had questions regarding how to tell the baby has completed a feeding. Dad at bedside. Parents verbalized they do recognize when baby is having feeding cues. Mom with questions about calming strategies for when baby is crying and not latching. Also, before discharge mom would like assistance with evaluating which breastpump flange size bests fits her as she is planning to return to work in may. ? ?Feeding ?Mother's Current Feeding Choice: Breast Milk ?Baby last fed at 8:30 am per mom. Baby asleep on mom's chest skin to skin. ?LATCH Score ?  ? ?  ? ?  ? ?  ? ?  ? ?  ? ? ?Lactation Tools Discussed/Used ? Mom would like assistance with flange fitting for return to work. Requested assistance to do that tomorrow. ? ?Interventions ?Interventions: Breast feeding basics reviewed;Education including: how to know the baby is getting enough, how to tell the baby has finished feeding, calming strategies, burping techniques.  ? ?Discharge ?Pump:  (Mom has a Spectra pump for when she returns to work in May.) ? ?Consult Status ?Consult Status: Follow-up ?Follow-up type: In-patient ? ? ? ?Lavonia Drafts ?02/10/2022, 11:04 AM ? ? ? ?

## 2022-02-10 NOTE — Discharge Summary (Signed)
? ?  Postpartum Discharge Summary ? ?Date of Service updated 02/11/2022 ? ?   ?Patient Name: Kathryn Wong ?DOB: 27-Oct-1996 ?MRN: [id number] ? ?Date of admission: 02/09/2022 ?Delivery date:02/10/2022  ?Delivering provider: Teresa Coombs P  ?Date of discharge: 02/11/2022 ? ?Admitting diagnosis: Irregular uterine contractions [O47.9] ?Intrauterine pregnancy: [redacted]w[redacted]d     ?Secondary diagnosis:  Principal Problem: ?  Post-dates pregnancy, delivered, current hospitalization ?Active Problems: ?  Labor and delivery, indication for care ?  Irregular uterine contractions ?  Postpartum care following vaginal delivery ? ?Additional problems: none    ?Discharge diagnosis: Term Pregnancy Delivered                                              ?Post partum procedures: none ?Augmentation: Pitocin ?Complications: None ? ?Hospital course: Onset of Labor With Vaginal Delivery      ?26 y.o. yo G2P0 at [redacted]w[redacted]d was admitted in Latent Labor on 02/09/2022. Patient had an uncomplicated labor course as follows:  ?Membrane Rupture Time/Date: 4:56 PM ,02/09/2022   ?Delivery Method:Vaginal, Vacuum (Extractor)  ?Episiotomy: None  ?Lacerations:  2nd degree;Perineal  ?Patient had an uncomplicated postpartum course.  She is ambulating, tolerating a regular diet, passing flatus, and urinating well. Breastfeeding independently.  Patient is discharged home in stable condition on 02/11/22. ? ?Newborn Data: ?Birth date:02/10/2022  ?Birth time:12:14 AM  ?Gender:Female  ?Living status:Living  ?Apgars:8 ,9  ?Weight:4150 g  ? ?Magnesium Sulfate received: No ?BMZ received: No ?Rhophylac:N/A ?MMR:No ?T-DaP:Given prenatally ?Flu: No ?Transfusion:No ? ?Physical exam  ?Vitals:  ? 02/10/22 1121 02/10/22 1543 02/11/22 0003 02/11/22 0759  ?BP: 112/67 112/69 124/64 104/67  ?Pulse: 88 89 88 74  ?Resp: $Remov'18 18 18 20  'TpoLXk$ ?Temp: 98.5 ?F (36.9 ?C) 99 ?F (37.2 ?C) (!) 97.5 ?F (36.4 ?C) 98.4 ?F (36.9 ?C)  ?TempSrc: Oral Oral  Oral  ?SpO2:  96% 98% 96%  ?Weight:      ?Height:      ? ?General:  alert ?Breasts: soft, nipples erect and intact bilaterally  ?Lochia: appropriate ?Uterine Fundus: firm ?Incision: N/A ?Perineum: well approximated, swollen  ?Extremities: trace BLE  ?DVT Evaluation: No evidence of DVT seen on physical exam. ?Labs: ?Lab Results  ?Component Value Date  ? WBC 19.7 (H) 02/10/2022  ? HGB 10.1 (L) 02/10/2022  ? HCT 29.8 (L) 02/10/2022  ? MCV 90.3 02/10/2022  ? PLT 230 02/10/2022  ? ?CMP Latest Ref Rng & Units 02/09/2022  ?Glucose 70 - 99 mg/dL 74  ?BUN 6 - 20 mg/dL 8  ?Creatinine 0.44 - 1.00 mg/dL 0.57  ?Sodium 135 - 145 mmol/L 136  ?Potassium 3.5 - 5.1 mmol/L 4.1  ?Chloride 98 - 111 mmol/L 108  ?CO2 22 - 32 mmol/L 20(L)  ?Calcium 8.9 - 10.3 mg/dL 9.5  ?Total Protein 6.5 - 8.1 g/dL 6.3(L)  ?Total Bilirubin 0.3 - 1.2 mg/dL 0.8  ?Alkaline Phos 38 - 126 U/L 164(H)  ?AST 15 - 41 U/L 20  ?ALT 0 - 44 U/L 11  ? ?Edinburgh Score: ?Edinburgh Postnatal Depression Scale Screening Tool 02/10/2022  ?I have been able to laugh and see the funny side of things. 0  ?I have looked forward with enjoyment to things. 0  ?I have blamed myself unnecessarily when things went wrong. 1  ?I have been anxious or worried for no good reason. 1  ?I have felt scared or panicky for no  good reason. 1  ?Things have been getting on top of me. 1  ?I have been so unhappy that I have had difficulty sleeping. 0  ?I have felt sad or miserable. 1  ?I have been so unhappy that I have been crying. 1  ?The thought of harming myself has occurred to me. 0  ?Edinburgh Postnatal Depression Scale Total 6  ? ? ? ? ?After visit meds:  ?Allergies as of 02/11/2022   ?No Known Allergies ?  ? ?  ?Medication List  ?  ? ?TAKE these medications   ? ?acetaminophen 325 MG tablet ?Commonly known as: Tylenol ?Take 2 tablets (650 mg total) by mouth every 4 (four) hours as needed (for pain scale < 4). ?  ?benzocaine-Menthol 20-0.5 % Aero ?Commonly known as: DERMOPLAST ?Apply 1 application. topically as needed for irritation (perineal discomfort). ?  ?coconut  oil Oil ?Apply 1 application. topically as needed. ?  ?dibucaine 1 % Oint ?Commonly known as: NUPERCAINAL ?Place 1 application. rectally as needed for hemorrhoids. ?  ?docusate sodium 100 MG capsule ?Commonly known as: COLACE ?Take 1 capsule (100 mg total) by mouth 2 (two) times daily. ?  ?ibuprofen 600 MG tablet ?Commonly known as: ADVIL ?Take 1 tablet (600 mg total) by mouth every 6 (six) hours. ?  ?prenatal multivitamin Tabs tablet ?Take 1 tablet by mouth daily at 12 noon. ?  ?simethicone 80 MG chewable tablet ?Commonly known as: MYLICON ?Chew 1 tablet (80 mg total) by mouth as needed for flatulence. ?  ?witch hazel-glycerin pad ?Commonly known as: TUCKS ?Apply 1 application. topically as needed for hemorrhoids. ?  ? ?  ? ?  ?  ? ? ?  ?Discharge Care Instructions  ?(From admission, onward)  ?  ? ? ?  ? ?  Start     Ordered  ? 02/11/22 0000  Discharge wound care:       ?Comments: SHOWER DAILY ?Wash incision gently with soap and water.  ?Call office with any drainage, redness, or firmness of the incision.  ? 02/11/22 0953  ? ?  ?  ? ?  ? ? ? ?Discharge home in stable condition ?Infant Feeding: Breast ?Infant Disposition:home with mother ?Discharge instruction: per After Visit Summary and Postpartum booklet. ?Activity: Advance as tolerated. Pelvic rest for 6 weeks.  ?Diet: routine diet ?Anticipated Birth Control: POPs ?Postpartum Appointment:6 weeks ?Additional Postpartum F/U: 2 week mood check  ?Future Appointments:No future appointments. ?Follow up Visit: ? Follow-up Information   ? ? Imagene Riches, CNM. Schedule an appointment as soon as possible for a visit in 6 week(s).   ?Specialties: Obstetrics, Gynecology ?Why: For Post Partum Check ?Contact information: ?[address] ?[place] [address] ?[phone] ? ? ?  ?  ? ?  ?  ? ?  ? ? ? ?  ? ?02/11/2022 ?Nyzier Boivin M Clayborne Divis, CNM ? ? ?

## 2022-02-10 NOTE — Progress Notes (Signed)
Obstetric Postpartum Daily Progress Note ?Subjective:  ?26 y.o. G2P1001 Day of birth  status post vaginal delivery.  She is ambulating- recently felt a little dizzy after getting up to [facility] quickly once she was back in bed, is tolerating po, is voiding spontaneously.  Her pain is well controlled on PO pain medications. Her lochia is less than menses. ?  ?Medications ?SCHEDULED MEDICATIONS  ? [START ON 02/11/2022] docusate sodium  100 mg Oral BID  ? ibuprofen  600 mg Oral Q6H  ? prenatal multivitamin  1 tablet Oral Q1200  ? [START ON 02/11/2022] Tdap  0.5 mL Intramuscular Once  ?  ?MEDICATION INFUSIONS  ? oxytocin 2.5 Units/hr (02/10/22 0155)  ?  ?PRN MEDICATIONS  ?acetaminophen, benzocaine-Menthol, coconut oil, witch hazel-glycerin **AND** dibucaine, diphenhydrAMINE, ondansetron **OR** ondansetron (ZOFRAN) IV, oxyCODONE, oxyCODONE, simethicone, zolpidem  ? ? ?Objective:  ? ?Vitals:  ? 02/10/22 0320 02/10/22 0430 02/10/22 9675 02/10/22 9163  ?BP: 124/74 123/71 119/74 124/81  ?Pulse: 84 83 79 98  ?Resp: 18 19 18 18   ?Temp: 98.5 ?F (36.9 ?C) 98.4 ?F (36.9 ?C) 98.7 ?F (37.1 ?C)   ?TempSrc: Oral Oral Oral   ?SpO2: 98% 99% 97% 98%  ?Weight:      ?Height:      ? ? Current Vital Signs 24h Vital Sign Ranges  ?T 98.7 ?F (37.1 ?C) Temp  Avg: 98.2 ?F (36.8 ?C)  Min: 97.8 ?F (36.6 ?C)  Max: 98.7 ?F (37.1 ?C)  ?BP 124/81 BP  Min: 108/71  Max: 165/111  ?HR 98 Pulse  Avg: 94.8  Min: 74  Max: 139  ?RR 18 Resp  Avg: 17.6  Min: 16  Max: 19  ?SaO2 98 % Room Air SpO2  Avg: 98.2 %  Min: 88 %  Max: 100 %  ?    ? 24 Hour I/O Current Shift I/O  ?Time ?Ins ?Outs 03/06 0701 - 03/07 0700 ?In: -  ?Out: 1075 [Urine:725] No intake/output data recorded.  ?General: NAD, color pink  ?Pulmonary: no increased work of breathing ?Breasts: soft, nipples erect and intact bilaterally ?Abdomen: non-distended, non-tender, fundus firm at level of umbilicus ?Perineum: well approximated, swollen  ?Extremities: no edema, no erythema, no tenderness ? ?Labs:   ?Recent Labs  ?Lab 02/09/22 ?1111 02/10/22 ?0542  ?WBC 12.5* 19.7*  ?HGB 12.2 10.1*  ?HCT 36.1 29.8*  ?PLT 266 230  ? ? ? ?Assessment:  ? 26 y.o. G2P1001 postpartum day of birth status post vacuum assist ? ?Plan:  ? ?1) Acute blood loss anemia - hemodynamically stable and asymptomatic ?- po ferrous sulfate ? ?2) O POS / Rubella 5.97 (06/29 0916)/ Varicella Immune ? ?3) TDAP status up to date ? ?4) breast /Contraception = condoms ? ?5) Disposition discharge  based on infant's needs after 24 hours, anticipate discharge 3/8. ? ?5/8 [facility], CNM  ?02/10/2022 9:03 AM    ?

## 2022-02-10 NOTE — Lactation Note (Addendum)
This note was copied from a baby's chart. ?Lactation Consultation Note ? ?Patient Name: Kathryn Wong ?Today's Date: 02/10/2022 ?Reason for consult: Follow-up assessment ?Age:28 hours Consult time was 1:30pm ? ?Maternal Data ?  ?See previous note ?Feeding ? Assisted mom with positioning baby at left breast in football hold. Mom had not used that position before and wanted to learn another position. Baby initially aroused with mom's handling. Assisted with placing baby in football hold and provided supportive pillows. Mom also has breastfeeding pillow. Baby quickly became sleepy. Mom hand expressed drops of colostrum which baby did lick and taste. Encouraged mom to put baby skin to skin and wait for baby to show feeding cues and offer the breast at that time. ? ?LATCH Score ?Latch: Too sleepy or reluctant, no latch achieved, no sucking elicited. ? ?Audible Swallowing: A few with stimulation ? ?Type of Nipple: Everted at rest and after stimulation ? ?Comfort (Breast/Nipple): Soft / non-tender ? ?Hold (Positioning): Assistance needed to correctly position infant at breast and maintain latch. ? ?LATCH Score: 6 ? ? ?Lactation Tools Discussed/Used ?  ? ?Interventions ?Interventions: Position options;Support pillows;Breast compression;Education;Skin to skin ? ?Discharge ?  ? ?Consult Status ?Consult Status: Follow-up ?Mother has LC contact number on board. Mom understands LC will check back in with mom in 30-40 minutes. ? ? ?Danford Bad ?02/10/2022, 2:50 PM ? ? ? ?

## 2022-02-10 NOTE — Progress Notes (Signed)
Inaccurate BP taken due to involuntary shaking of the patients arm. RN moved BP cuff to another location. BP 136/80 at 2120. ?

## 2022-02-11 LAB — BPAM RBC
Blood Product Expiration Date: 202304052359
Blood Product Expiration Date: 202304052359
Unit Type and Rh: 5100
Unit Type and Rh: 5100

## 2022-02-11 LAB — TYPE AND SCREEN
ABO/RH(D): O POS
Antibody Screen: POSITIVE
Unit division: 0
Unit division: 0

## 2022-02-11 MED ORDER — IBUPROFEN 600 MG PO TABS: 600.0000 mg | ORAL_TABLET | Freq: Four times a day (QID) | ORAL | 0 refills | Status: AC

## 2022-02-11 MED ORDER — BENZOCAINE-MENTHOL 20-0.5 % EX AERO: 1.0000 "application " | INHALATION_SPRAY | CUTANEOUS | Status: AC | PRN

## 2022-02-11 MED ORDER — SIMETHICONE 80 MG PO CHEW: 80.0000 mg | CHEWABLE_TABLET | ORAL | 0 refills | Status: AC | PRN

## 2022-02-11 MED ORDER — DIBUCAINE (PERIANAL) 1 % EX OINT: 1.0000 "application " | TOPICAL_OINTMENT | CUTANEOUS | Status: AC | PRN

## 2022-02-11 MED ORDER — DOCUSATE SODIUM 100 MG PO CAPS: 100.0000 mg | ORAL_CAPSULE | Freq: Two times a day (BID) | ORAL | 0 refills | Status: AC

## 2022-02-11 MED ORDER — WITCH HAZEL-GLYCERIN EX PADS: 1.0000 "application " | MEDICATED_PAD | CUTANEOUS | 12 refills | Status: AC | PRN

## 2022-02-11 MED ORDER — COCONUT OIL OIL: 1.0000 "application " | TOPICAL_OIL | 0 refills | Status: AC | PRN

## 2022-02-11 MED ORDER — ACETAMINOPHEN 325 MG PO TABS: 650.0000 mg | ORAL_TABLET | ORAL | Status: AC | PRN

## 2022-02-11 NOTE — Discharge Instructions (Signed)

## 2022-02-11 NOTE — Lactation Note (Signed)
This note was copied from a baby's chart. ?Lactation Consultation Note ? ?Patient Name: Kathryn Wong ?Today's Date: 02/11/2022 ?Reason for consult: Follow-up assessment;Primapara;Term ?Age:28 hours ? ?Lactation follow-up prior to anticipated discharge. ? ?Maternal Data ?Has patient been taught Hand Expression?: Yes ?Does the patient have breastfeeding experience prior to this delivery?: No ? ?Feeding ?Mother's Current Feeding Choice: Breast Milk ? ?Mom has been independent with feedings since yesterday afternoon. LC and LC student observed mom putting baby to breast in good cross-cradle position, sandwiching of tissue and latching baby. ? ?LATCH Score ?Latch: Grasps breast easily, tongue down, lips flanged, rhythmical sucking. ? ?Audible Swallowing: Spontaneous and intermittent ? ?Type of Nipple: Everted at rest and after stimulation ? ?Comfort (Breast/Nipple): Soft / non-tender ? ?Hold (Positioning): No assistance needed to correctly position infant at breast. ? ?LATCH Score: 10 ? ? ?Lactation Tools Discussed/Used ?Tools: Coconut oil ? ?Interventions ?Interventions: Breast feeding basics reviewed;Hand express;Pre-pump if needed;Hand pump;Ice;Education;Coconut oil ? ?Mom plans to return to work in May and had questions re: adding in pumping and bottle feeding. Questions addressed; no further questions. ?LC student reviewed cluster feeding, growth spurts, feeding behaviors, feeding on demand/early cues, output expectations, milk supply and demand, and normal course of lactation. ? ?Discharge ?Discharge Education: Engorgement and breast care;Warning signs for feeding baby;Outpatient recommendation ?Pump: Personal (Spectra) ? ?Guidance given for anticipated breast fullness and engorgement and care for the nipples. Information given for outpatient lactation support; encouraged to call as needed. ? ?Consult Status ?Consult Status: Complete ?Date: 02/11/22 ?Follow-up type: Call as needed ? ? ? ?Danford Bad ?02/11/2022, 10:00 AM ? ? ? ?

## 2022-02-11 NOTE — Progress Notes (Signed)
Patient discharged home.  Discharge instructions given to parents. Mother verbalized understanding.  Tag removed, bands matched, escorted by auxiliary.  

## 2022-02-13 ENCOUNTER — Encounter: Payer: Self-pay | Admitting: Obstetrics

## 2022-02-16 ENCOUNTER — Ambulatory Visit (INDEPENDENT_AMBULATORY_CARE_PROVIDER_SITE_OTHER): Payer: BC Managed Care – PPO | Admitting: Obstetrics

## 2022-02-16 ENCOUNTER — Encounter: Payer: Self-pay | Admitting: Obstetrics

## 2022-02-16 ENCOUNTER — Other Ambulatory Visit: Payer: Self-pay

## 2022-02-16 NOTE — Progress Notes (Signed)
? ? ?Obstetrics & Gynecology Office Visit  ? ?Chief Complaint:  ?Chief Complaint  ?Patient presents with  ? Edema  ?  Swelling on Friday, unable to get shoes on. Elevated feet and pushed fluids. Resolved on Saturday morning. Denies heachache, blured vision. ?  ? ? ?History of Present Illness: Kathryn Wong presents at one week post delivery for a blood pressure check and to follow up on her concerns regarding some ankle and pedal edema she developed over the weekend.this has now resolved. She denies headache, blurred vision. Her perineum is healing, feels slightly sore, and her bleeding is minimal Breastfeeding is going well, and she is trying to balance her baby's sleep and wake cycles. ?  ?Review of Systems:  ?Review of Systems  ?Constitutional: Negative.   ?HENT: Negative.    ?Eyes: Negative.   ?Respiratory: Negative.    ?Cardiovascular: Negative.   ?Gastrointestinal: Negative.   ?Genitourinary: Negative.   ?Musculoskeletal: Negative.   ?Skin: Negative.   ?Neurological: Negative.   ?Endo/Heme/Allergies: Negative.   ?Psychiatric/Behavioral: Negative.     ? ?Past Medical History:  ?Past Medical History:  ?Diagnosis Date  ? Ectopic pregnancy 12/24/2020  ? Migraine   ? ? ?Past Surgical History:  ?Past Surgical History:  ?Procedure Laterality Date  ? WISDOM TOOTH EXTRACTION    ? ? ?Gynecologic History: Patient's last menstrual period was 02/10/2022. ? ?Obstetric History: G2P1001 ? ?Family History:  ?Family History  ?Problem Relation Age of Onset  ? Healthy Mother   ? Healthy Father   ? ? ?Social History:  ?Social History  ? ?Socioeconomic History  ? Marital status: Married  ?  Spouse name: Rokaya Bogacz  ? Number of children: Not on file  ? Years of education: Not on file  ? Highest education level: Not on file  ?Occupational History  ? Not on file  ?Tobacco Use  ? Smoking status: Never  ? Smokeless tobacco: Never  ?Vaping Use  ? Vaping Use: Never used  ?Substance and Sexual Activity  ? Alcohol use: Not Currently  ?   Comment: socially  ? Drug use: Never  ? Sexual activity: Yes  ?  Birth control/protection: None  ?Other Topics Concern  ? Not on file  ?Social History Narrative  ? Not on file  ? ?Social Determinants of Health  ? ?Financial Resource Strain: Not on file  ?Food Insecurity: Not on file  ?Transportation Needs: Not on file  ?Physical Activity: Not on file  ?Stress: Not on file  ?Social Connections: Not on file  ?Intimate Partner Violence: Not on file  ? ? ?Allergies:  ?No Known Allergies ? ?Medications: ?Prior to Admission medications   ?Medication Sig Start Date End Date Taking? Authorizing Provider  ?acetaminophen (TYLENOL) 325 MG tablet Take 2 tablets (650 mg total) by mouth every 4 (four) hours as needed (for pain scale < 4). 02/11/22  Yes Dominic, Nunzio Cobbs, CNM  ?benzocaine-Menthol (DERMOPLAST) 20-0.5 % AERO Apply 1 application. topically as needed for irritation (perineal discomfort). 02/11/22  Yes Dominic, Nunzio Cobbs, CNM  ?dibucaine (NUPERCAINAL) 1 % OINT Place 1 application. rectally as needed for hemorrhoids. 02/11/22  Yes Dominic, Nunzio Cobbs, CNM  ?docusate sodium (COLACE) 100 MG capsule Take 1 capsule (100 mg total) by mouth 2 (two) times daily. 02/11/22  Yes Dominic, Nunzio Cobbs, CNM  ?ibuprofen (ADVIL) 600 MG tablet Take 1 tablet (600 mg total) by mouth every 6 (six) hours. 02/11/22  Yes Dominic, Nunzio Cobbs, CNM  ?Prenatal Vit-Fe Fumarate-FA (PRENATAL MULTIVITAMIN) TABS tablet Take 1  tablet by mouth daily at 12 noon.   Yes [provider]  ?witch hazel-glycerin (TUCKS) pad Apply 1 application. topically as needed for hemorrhoids. 02/11/22  Yes Dominic, Nunzio Cobbs, CNM  ?coconut oil OIL Apply 1 application. topically as needed. ?Patient not taking: Reported on 02/16/2022 02/11/22   Allen Derry, CNM  ?simethicone (MYLICON) 80 MG chewable tablet Chew 1 tablet (80 mg total) by mouth as needed for flatulence. ?Patient not taking: Reported on 02/16/2022 02/11/22   Allen Derry, CNM  ? ? ?Physical  Exam ?Vitals:  ?Vitals:  ? 02/16/22 1027  ?BP: 130/78  ?Pulse: 94  ? ?Patient's last menstrual period was 02/10/2022. ? ?Physical Exam ?Constitutional:   ?   Appearance: Normal appearance.  ?Cardiovascular:  ?   Rate and Rhythm: Normal rate and regular rhythm.  ?Pulmonary:  ?   Effort: Pulmonary effort is normal.  ?   Breath sounds: Normal breath sounds.  ?Musculoskeletal:     ?   General: Normal range of motion.  ?   Cervical back: Normal range of motion and neck supple.  ?Skin: ?   General: Skin is warm and dry.  ?Neurological:  ?   General: No focal deficit present.  ?   Mental Status: She is alert and oriented to person, place, and time.  ?Psychiatric:     ?   Mood and Affect: Mood normal.     ?   Behavior: Behavior normal.  ? ?Her legs and ankles are not edematous at all. ? ?Assessment: 26 y.o. at one week post delivery- for evaluation of concern re leg edema ?Plan: ?Problem List Items Addressed This Visit   ?None ?Visit Diagnoses   ? ? Postpartum care and examination    -  Primary  ? ?  ?She is reassured by her lac of physical findings. Explained that she had received IV fluids and had a long second stage labor, all of which contributed to her earlier edema, which has now resolved. ?RTC in one week for mood check, and again in 5 weeks fr physical. ? ?Imagene Riches, CNM  ?02/16/2022 11:00 AM  ? ? ? ?

## 2022-02-16 NOTE — Telephone Encounter (Signed)
Patient is scheduled for 02/16/22

## 2022-02-24 ENCOUNTER — Encounter: Payer: Self-pay | Admitting: Obstetrics

## 2022-02-24 ENCOUNTER — Other Ambulatory Visit: Payer: Self-pay

## 2022-02-24 ENCOUNTER — Ambulatory Visit (INDEPENDENT_AMBULATORY_CARE_PROVIDER_SITE_OTHER): Payer: BC Managed Care – PPO | Admitting: Obstetrics

## 2022-02-24 NOTE — Progress Notes (Signed)
? ? ?Obstetrics & Gynecology Office Visit  ? ?Chief Complaint:  ?Chief Complaint  ?Patient presents with  ? Postpartum Care  ? ? ?History of Present Illness: Kathryn Wong present for a two week PP visit , largely for a mood check.She delivered on 02/10/2022 via vacuum assist. Is here with her baby today. Kathryn Wong is smiling, denies any physical or emotional struggles. She is breastfeeding the baby , and this I going well. ?Her perineum is not sore, and her bleeding is very light at this point. ?  ?Review of Systems:  ?Review of Systems  ?Constitutional: Negative.   ?HENT: Negative.    ?Eyes: Negative.   ?Respiratory: Negative.    ?Cardiovascular: Negative.   ?Gastrointestinal: Negative.   ?Genitourinary: Negative.   ?Musculoskeletal: Negative.   ?Skin: Negative.   ?Neurological: Negative.   ?Endo/Heme/Allergies: Negative.   ?Psychiatric/Behavioral: Negative.     ? ?Past Medical History:  ?Past Medical History:  ?Diagnosis Date  ? Ectopic pregnancy 12/24/2020  ? Migraine   ? Vaginal discharge 11/06/2021  ? ? ?Past Surgical History:  ?Past Surgical History:  ?Procedure Laterality Date  ? WISDOM TOOTH EXTRACTION    ? ? ?Gynecologic History: Patient's last menstrual period was 02/10/2022. ? ?Obstetric History: G2P1001 ? ?Family History:  ?Family History  ?Problem Relation Age of Onset  ? Healthy Mother   ? Healthy Father   ? ? ?Social History:  ?Social History  ? ?Socioeconomic History  ? Marital status: Married  ?  Spouse name: [name]  ? Number of children: Not on file  ? Years of education: Not on file  ? Highest education level: Not on file  ?Occupational History  ? Not on file  ?Tobacco Use  ? Smoking status: Never  ? Smokeless tobacco: Never  ?Vaping Use  ? Vaping Use: Never used  ?Substance and Sexual Activity  ? Alcohol use: Not Currently  ?  Comment: socially  ? Drug use: Never  ? Sexual activity: Yes  ?  Birth control/protection: None  ?Other Topics Concern  ? Not on file  ?Social History Narrative  ? Not on  file  ? ?Social Determinants of Health  ? ?Financial Resource Strain: Not on file  ?Food Insecurity: Not on file  ?Transportation Needs: Not on file  ?Physical Activity: Not on file  ?Stress: Not on file  ?Social Connections: Not on file  ?Intimate Partner Violence: Not on file  ? ? ?Allergies:  ?No Known Allergies ? ?Medications: ?Prior to Admission medications   ?Medication Sig Start Date End Date Taking? Authorizing Provider  ?acetaminophen (TYLENOL) 325 MG tablet Take 2 tablets (650 mg total) by mouth every 4 (four) hours as needed (for pain scale < 4). 02/11/22   Dominic, Courtney Heys, CNM  ?benzocaine-Menthol (DERMOPLAST) 20-0.5 % AERO Apply 1 application. topically as needed for irritation (perineal discomfort). 02/11/22   Dominic, Courtney Heys, CNM  ?coconut oil OIL Apply 1 application. topically as needed. ?Patient not taking: Reported on 02/16/2022 02/11/22   Ellwood Sayers, CNM  ?dibucaine (NUPERCAINAL) 1 % OINT Place 1 application. rectally as needed for hemorrhoids. 02/11/22   Dominic, Courtney Heys, CNM  ?docusate sodium (COLACE) 100 MG capsule Take 1 capsule (100 mg total) by mouth 2 (two) times daily. 02/11/22   Dominic, Courtney Heys, CNM  ?ibuprofen (ADVIL) 600 MG tablet Take 1 tablet (600 mg total) by mouth every 6 (six) hours. 02/11/22   Dominic, Courtney Heys, CNM  ?Prenatal Vit-Fe Fumarate-FA (PRENATAL MULTIVITAMIN) TABS tablet Take 1 tablet by  mouth daily at 12 noon.    [provider]  ?simethicone (MYLICON) 80 MG chewable tablet Chew 1 tablet (80 mg total) by mouth as needed for flatulence. ?Patient not taking: Reported on 02/16/2022 02/11/22   Ellwood Sayers, CNM  ?witch hazel-glycerin (TUCKS) pad Apply 1 application. topically as needed for hemorrhoids. 02/11/22   Dominic, Courtney Heys, CNM  ? ? ?Physical Exam ?Vitals:  ?Vitals:  ? 02/24/22 1109  ?BP: 122/70  ? ?Patient's last menstrual period was 02/10/2022. ? ?Physical Exam ?Constitutional:   ?   Appearance: Normal appearance. She is normal  weight.  ?Cardiovascular:  ?   Rate and Rhythm: Normal rate and regular rhythm.  ?   Pulses: Normal pulses.  ?   Heart sounds: Normal heart sounds.  ?Pulmonary:  ?   Effort: Pulmonary effort is normal.  ?   Breath sounds: Normal breath sounds.  ?Genitourinary: ?   Comments: deferred ?Musculoskeletal:     ?   General: Normal range of motion.  ?Skin: ?   General: Skin is warm and dry.  ?Neurological:  ?   General: No focal deficit present.  ?   Mental Status: She is alert and oriented to person, place, and time.  ?Psychiatric:     ?   Mood and Affect: Mood normal.     ?   Behavior: Behavior normal.  ?   Comments: Edinburgh score is 7  ? ? ? ?Assessment: 26 y.o. G2P1001 -2 week postpartum mood check, doing well ? ?Plan: ?Problem List Items Addressed This Visit   ?None ?Visit Diagnoses   ? ? Postpartum care and examination    -  Primary  ? ?  ?She will return in 4 weeks for her 6 week PP physical ?She will need a pap smear. ?She plans condoms for contraception. ? ?Mirna Mires, CNM  ?02/24/2022 11:45 AM  ? ? ? ? ?

## 2022-02-27 IMAGING — US US OB < 14 WEEKS - US OB TV
1 series · 14 of 28 positions shown · non-contrast
Comparison: None.

CLINICAL DATA: Vaginal bleeding

EXAM:
OBSTETRIC <14 WK US AND TRANSVAGINAL OB US
TECHNIQUE: Both transabdominal and transvaginal ultrasound examinations were
performed for complete evaluation of the gestation as well as the
maternal uterus, adnexal regions, and pelvic cul-de-sac.
Transvaginal technique was performed to assess early pregnancy.

[Series 1: us ob comp less 14 wks · 110 acquisitions, 14 frames shown]
[im 5/110]
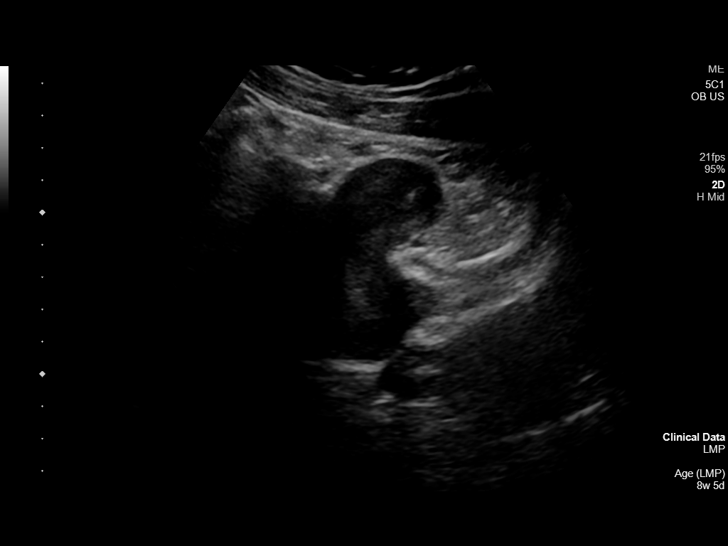
[im 13/110]
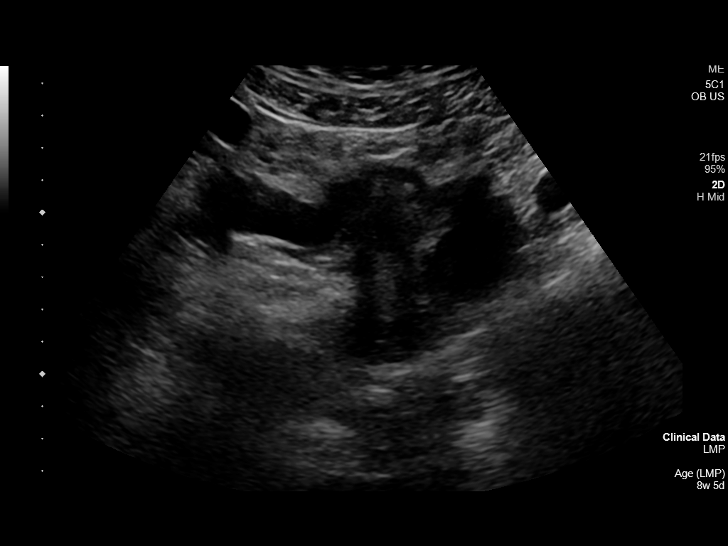
[im 21/110]
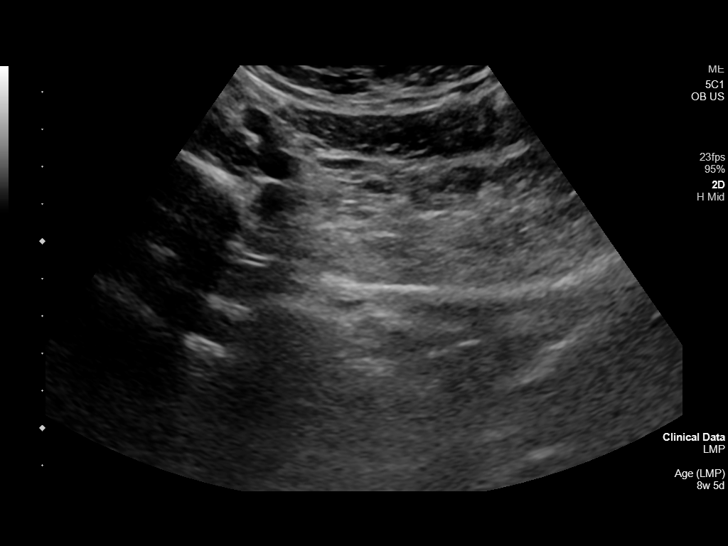
[im 29/110]
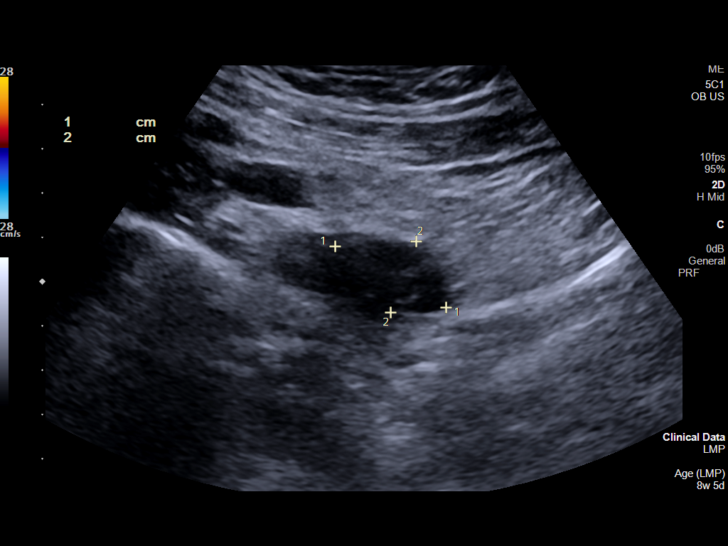
[im 37/110]
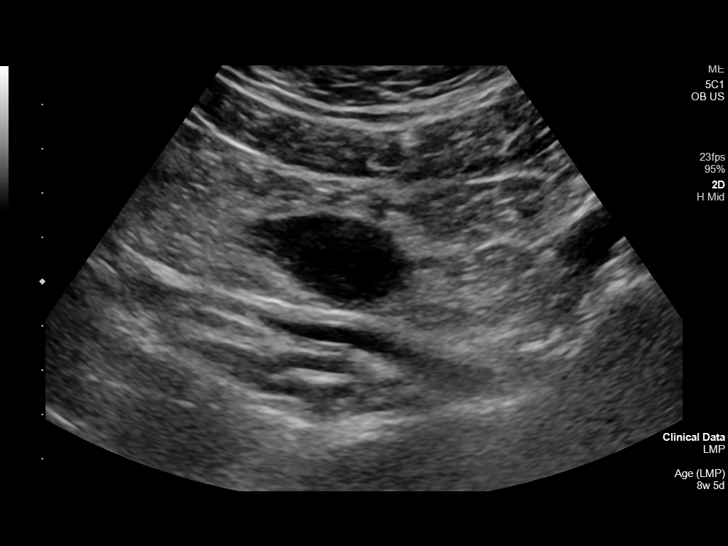
[im 45/110]
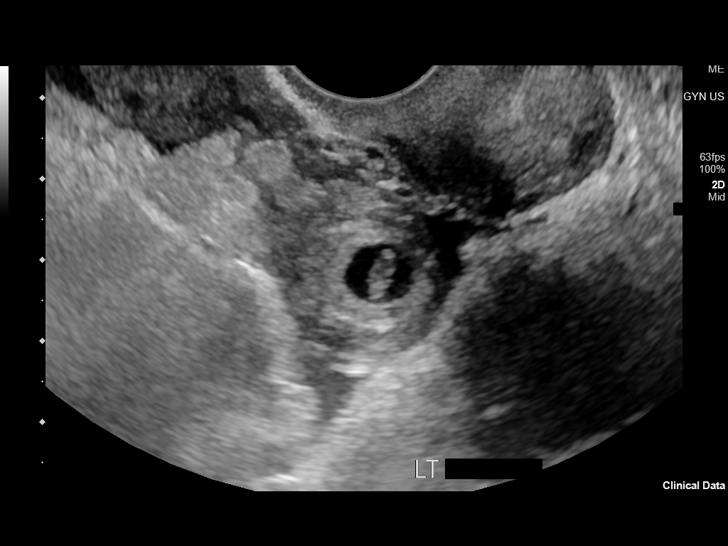
[im 53/110]
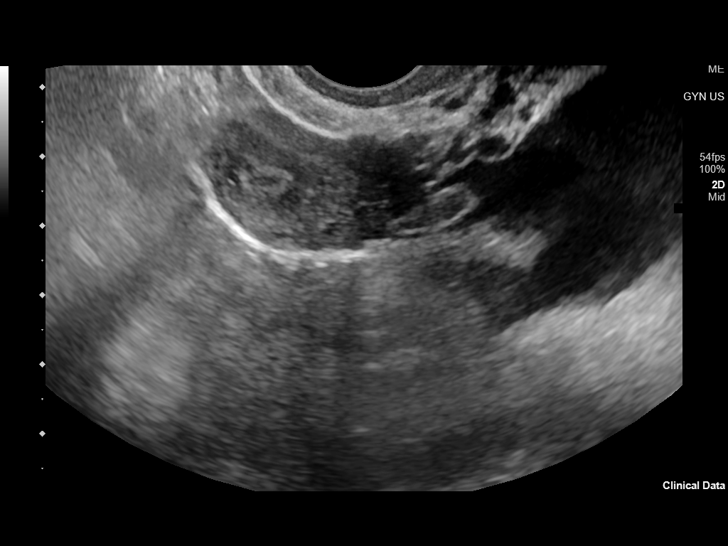
[im 61/110]
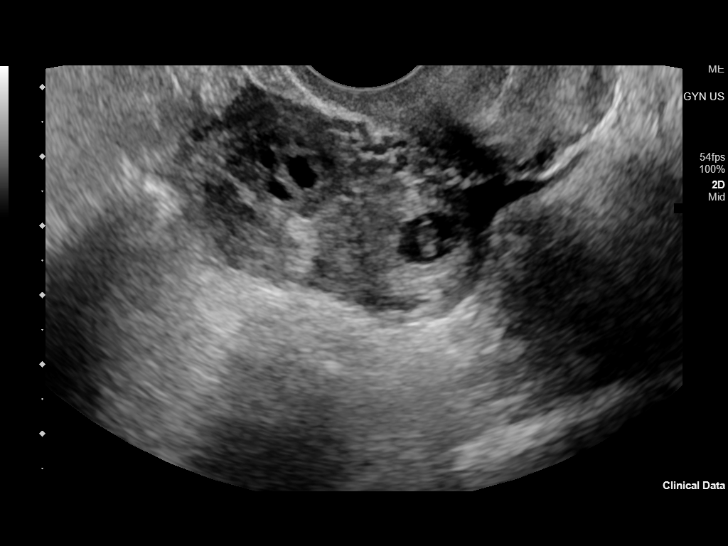
[im 69/110]
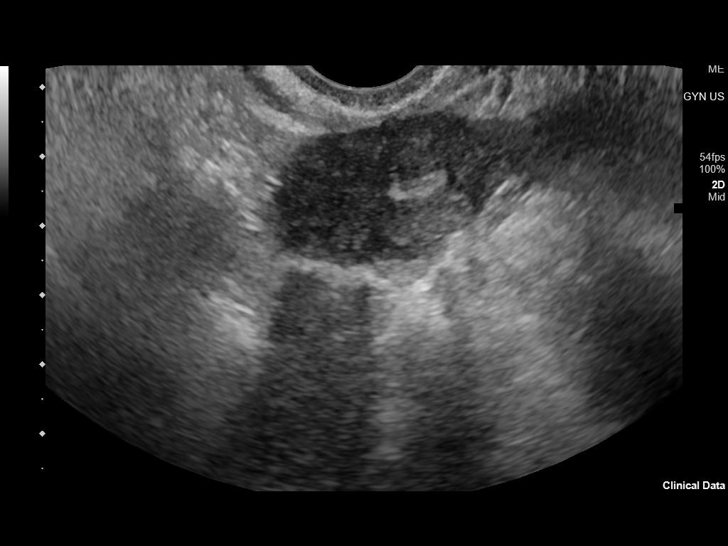
[im 77/110]
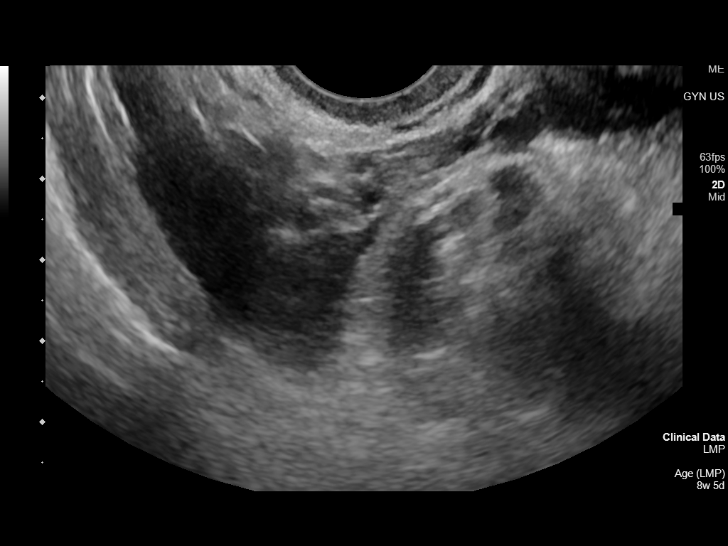
[im 85/110]
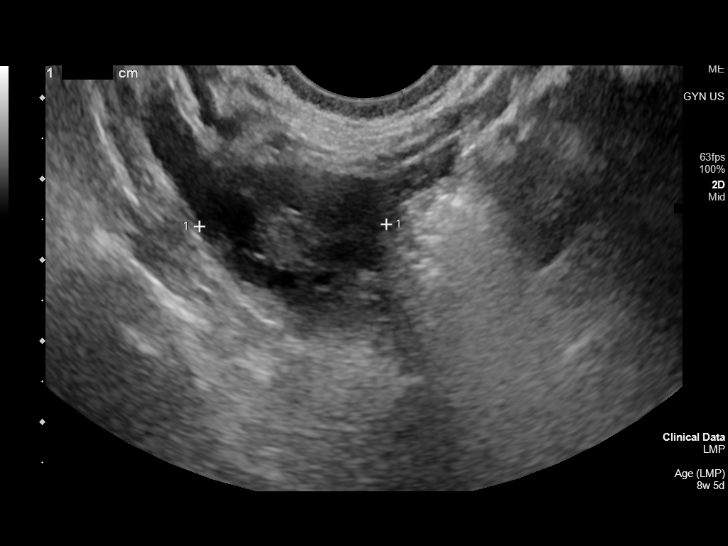
[im 93/110]
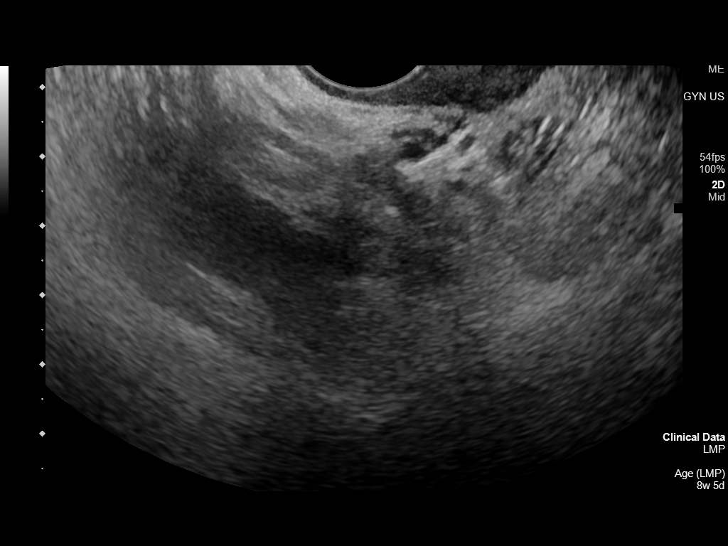
[im 101/110]
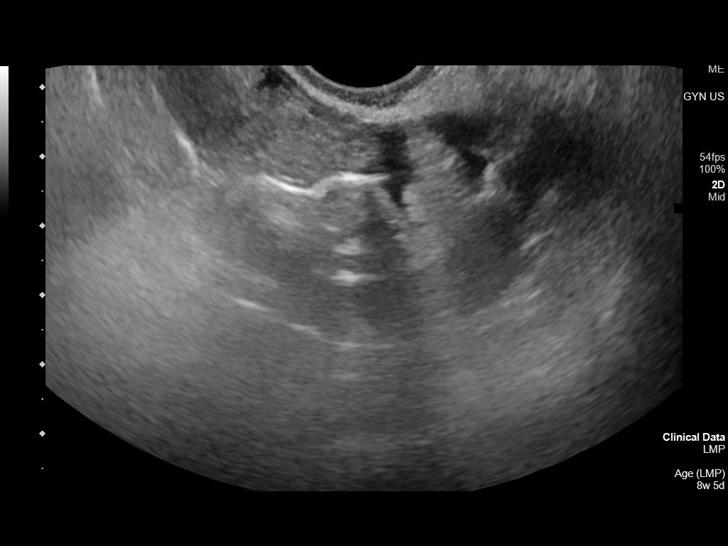
[im 110/110]
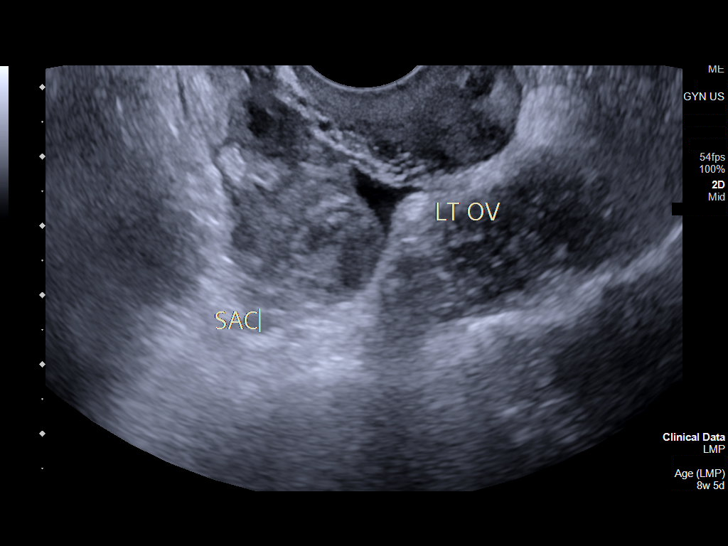

[14 of 28 positions shown; findings below may reference images not displayed]

FINDINGS: Intrauterine gestational sac: None

Yolk sac:  Not Visualized.

Embryo:  Visualized within the left adnexa.

Cardiac Activity: Not Visualized.

Heart Rate: N/A  bpm

CRL:  6.4 mm   6 w   3 d                  US EDC: N/A

Subchorionic hemorrhage:  Moderate

Maternal uterus/adnexae:

The right ovary measures 2.9 cm x 1.6 cm x 2.3 cm and is normal in
appearance.

The left ovary measures 2.7 cm x 2.0 cm x 2.5 cm and is normal in
appearance.

A 1.5 cm x 1.7 cm left adnexal gestational sac is seen, adjacent to
the left ovary. This contains a fetal pole which measures 6.4 mm. No
fetal cardiac activity is identified.
IMPRESSION: 1. Findings consistent with a nonviable left adnexal ectopic
pregnancy, at approximately 6 weeks and 3 days gestation by
ultrasound evaluation.

## 2022-03-01 ENCOUNTER — Encounter: Payer: Self-pay | Admitting: Obstetrics

## 2022-03-09 ENCOUNTER — Ambulatory Visit: Payer: BC Managed Care – PPO | Admitting: Obstetrics and Gynecology

## 2022-03-09 ENCOUNTER — Encounter: Payer: Self-pay | Admitting: Obstetrics and Gynecology

## 2022-03-09 VITALS — BP 122/70 | Ht 63.0 in | Wt 168.0 lb

## 2022-03-09 DIAGNOSIS — N771 Vaginitis, vulvitis and vulvovaginitis in diseases classified elsewhere: Secondary | ICD-10-CM | POA: Diagnosis not present

## 2022-03-09 DIAGNOSIS — R875 Abnormal microbiological findings in specimens from female genital organs: Secondary | ICD-10-CM

## 2022-03-09 DIAGNOSIS — B3731 Acute candidiasis of vulva and vagina: Secondary | ICD-10-CM | POA: Diagnosis not present

## 2022-03-09 DIAGNOSIS — L280 Lichen simplex chronicus: Secondary | ICD-10-CM

## 2022-03-09 MED ORDER — CLOBETASOL PROPIONATE 0.05 % EX OINT: TOPICAL_OINTMENT | CUTANEOUS | 0 refills | Status: DC

## 2022-03-09 MED ORDER — FLUCONAZOLE 150 MG PO TABS: 150.0000 mg | ORAL_TABLET | ORAL | 0 refills | Status: AC

## 2022-03-09 NOTE — Progress Notes (Signed)
? ?Patient ID: Kathryn Wong, female   DOB: 25-Sep-1996, 26 y.o.   MRN: [id number] ? ?Reason for Consult: Vaginitis ?  ?Referred by No ref. provider found ? ?Subjective:  ?   ?HPI: ? ?Kathryn Wong is a 26 y.o. female.  She is recently postpartum.  She reports that she has been having issues with a vulvar rash since shortly after her delivery.  She has tried several over-the-counter ointments to improve her symptoms.  She has tried over-the-counter Monistat, desonide cream, Aquacel, and Dermoplast.  Dermoplast generally helps with controlling her symptoms.  She feels like some patch she was using may have irritated her groin area more. ? ?Gynecological History ? ?Patient's last menstrual period was 02/10/2022. ? ?Past Medical History:  ?Diagnosis Date  ? Amniotic fluid leaking 11/06/2021  ? Ectopic pregnancy 12/24/2020  ? Migraine   ? Vaginal discharge 11/06/2021  ? ?Family History  ?Problem Relation Age of Onset  ? Healthy Mother   ? Healthy Father   ? ?Past Surgical History:  ?Procedure Laterality Date  ? WISDOM TOOTH EXTRACTION    ? ? ?Short Social History:  ?Social History  ? ?Tobacco Use  ? Smoking status: Never  ? Smokeless tobacco: Never  ?Substance Use Topics  ? Alcohol use: Not Currently  ?  Comment: socially  ? ? ?No Known Allergies ? ?Current Outpatient Medications  ?Medication Sig Dispense Refill  ? clobetasol ointment (TEMOVATE) 0.05 % Apply to affected area every night for 4 weeks 30 g 0  ? fluconazole (DIFLUCAN) 150 MG tablet Take 1 tablet (150 mg total) by mouth every 3 (three) days for 2 doses. 2 tablet 0  ? acetaminophen (TYLENOL) 325 MG tablet Take 2 tablets (650 mg total) by mouth every 4 (four) hours as needed (for pain scale < 4).    ? benzocaine-Menthol (DERMOPLAST) 20-0.5 % AERO Apply 1 application. topically as needed for irritation (perineal discomfort).    ? coconut oil OIL Apply 1 application. topically as needed. (Patient not taking: Reported on 02/16/2022)  0  ? dibucaine (NUPERCAINAL) 1 %  OINT Place 1 application. rectally as needed for hemorrhoids.    ? docusate sodium (COLACE) 100 MG capsule Take 1 capsule (100 mg total) by mouth 2 (two) times daily. 10 capsule 0  ? ibuprofen (ADVIL) 600 MG tablet Take 1 tablet (600 mg total) by mouth every 6 (six) hours. 30 tablet 0  ? Prenatal Vit-Fe Fumarate-FA (PRENATAL MULTIVITAMIN) TABS tablet Take 1 tablet by mouth daily at 12 noon.    ? simethicone (MYLICON) 80 MG chewable tablet Chew 1 tablet (80 mg total) by mouth as needed for flatulence. (Patient not taking: Reported on 02/16/2022) 30 tablet 0  ? witch hazel-glycerin (TUCKS) pad Apply 1 application. topically as needed for hemorrhoids. 40 each 12  ? ?No current facility-administered medications for this visit.  ? ? ?Review of Systems  ?Constitutional: Negative for chills, fatigue, fever and unexpected weight change.  ?HENT: Negative for trouble swallowing.  ?Eyes: Negative for loss of vision.  ?Respiratory: Negative for cough, shortness of breath and wheezing.  ?Cardiovascular: Negative for chest pain, leg swelling, palpitations and syncope.  ?GI: Negative for abdominal pain, blood in stool, diarrhea, nausea and vomiting.  ?GU: Negative for difficulty urinating, dysuria, frequency and hematuria.  ?Musculoskeletal: Negative for back pain, leg pain and joint pain.  ?Skin: Negative for rash.  ?Neurological: Negative for dizziness, headaches, light-headedness, numbness and seizures.  ?Psychiatric: Negative for behavioral problem, confusion, depressed mood and sleep disturbance.   ? ?   ?  Objective:  ?Objective  ? ?Vitals:  ? 03/09/22 1040  ?BP: 122/70  ?Weight: 168 lb (76.2 kg)  ?Height: 5\' 3"  (1.6 m)  ? ?Body mass index is 29.76 kg/m?. ? ?Physical Exam ?Vitals and nursing note reviewed. Exam conducted with a chaperone present.  ?Constitutional:   ?   Appearance: Normal appearance. She is well-developed.  ?HENT:  ?   Head: Normocephalic and atraumatic.  ?Eyes:  ?   Extraocular Movements: Extraocular movements  intact.  ?   Pupils: Pupils are equal, round, and reactive to light.  ?Cardiovascular:  ?   Rate and Rhythm: Normal rate and regular rhythm.  ?Pulmonary:  ?   Effort: Pulmonary effort is normal. No respiratory distress.  ?   Breath sounds: Normal breath sounds.  ?Abdominal:  ?   General: Abdomen is flat.  ?   Palpations: Abdomen is soft.  ?Genitourinary: ? ? ?Musculoskeletal:     ?   General: No signs of injury.  ?Skin: ?   General: Skin is warm and dry.  ?Neurological:  ?   Mental Status: She is alert and oriented to person, place, and time.  ?Psychiatric:     ?   Behavior: Behavior normal.     ?   Thought Content: Thought content normal.     ?   Judgment: Judgment normal.  ? ? ?Assessment/Plan:  ?  ? ?26 year old with vulvar irritation.  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 ?We will treat with oral Diflucan and topical clobetasol ointment.  Instructed patient to avoid any vulvar irritants and to soak in warm water if needed for pain.  ?Will plan to follow up in 1 week.  ? ?More than 15 minutes were spent face to face with the patient in the room, reviewing the medical record, labs and images, and coordinating care for the patient. The plan of management was discussed in detail and counseling was provided.  ?  ?34 MD ?[facility], [facility] ?03/09/2022 ?11:40 AM ? ? ?

## 2022-03-11 ENCOUNTER — Ambulatory Visit: Payer: BC Managed Care – PPO | Admitting: Obstetrics

## 2022-03-12 LAB — NUSWAB BV AND CANDIDA, NAA
Atopobium vaginae: HIGH Score — AB
Candida albicans, NAA: NEGATIVE
Candida glabrata, NAA: NEGATIVE
Megasphaera 1: HIGH Score — AB

## 2022-03-17 ENCOUNTER — Other Ambulatory Visit: Payer: Self-pay | Admitting: Obstetrics and Gynecology

## 2022-03-17 DIAGNOSIS — B9689 Other specified bacterial agents as the cause of diseases classified elsewhere: Secondary | ICD-10-CM

## 2022-03-17 MED ORDER — METRONIDAZOLE 0.75 % VA GEL: 1.0000 | Freq: Every day | VAGINAL | 0 refills | Status: AC

## 2022-03-18 ENCOUNTER — Ambulatory Visit: Payer: BC Managed Care – PPO | Admitting: Obstetrics and Gynecology

## 2022-03-18 ENCOUNTER — Encounter: Payer: Self-pay | Admitting: Obstetrics and Gynecology

## 2022-03-18 VITALS — BP 120/80 | Ht 63.0 in | Wt 167.0 lb

## 2022-03-18 DIAGNOSIS — B9689 Other specified bacterial agents as the cause of diseases classified elsewhere: Secondary | ICD-10-CM

## 2022-03-18 DIAGNOSIS — N760 Acute vaginitis: Secondary | ICD-10-CM

## 2022-03-18 DIAGNOSIS — L280 Lichen simplex chronicus: Secondary | ICD-10-CM | POA: Diagnosis not present

## 2022-03-18 NOTE — Progress Notes (Signed)
? ?Patient ID: Kathryn Wong, female   DOB: [birth year], 26 y.o.   MRN: [id number] ? ?Reason for Consult: Follow-up ?  ?Referred by No ref. provider found ? ?Subjective:  ?   ?HPI: ? ?Kathryn Wong is a 26 y.o. female she reports that she has noted some improvement over the last week of her bottom and vulvar skin rash.  She has been using clobetasol nightly with significant improvement in skin appearance an d itching. Still some occasional itching at night.   She recently started her MetroGel which was prescribed her vaginal swab showed bacterial vaginosis. ? ?Gynecological History ? ?No LMP recorded. ? ?Past Medical History:  ?Diagnosis Date  ? Amniotic fluid leaking 11/06/2021  ? Ectopic pregnancy 12/24/2020  ? Migraine   ? Vaginal discharge 11/06/2021  ? ?Family History  ?Problem Relation Age of Onset  ? Healthy Mother   ? Healthy Father   ? ?Past Surgical History:  ?Procedure Laterality Date  ? WISDOM TOOTH EXTRACTION    ? ? ?Short Social History:  ?Social History  ? ?Tobacco Use  ? Smoking status: Never  ? Smokeless tobacco: Never  ?Substance Use Topics  ? Alcohol use: Not Currently  ?  Comment: socially  ? ? ?No Known Allergies ? ?Current Outpatient Medications  ?Medication Sig Dispense Refill  ? acetaminophen (TYLENOL) 325 MG tablet Take 2 tablets (650 mg total) by mouth every 4 (four) hours as needed (for pain scale < 4).    ? benzocaine-Menthol (DERMOPLAST) 20-0.5 % AERO Apply 1 application. topically as needed for irritation (perineal discomfort).    ? clobetasol ointment (TEMOVATE) 0.05 % Apply to affected area every night for 4 weeks 30 g 0  ? coconut oil OIL Apply 1 application. topically as needed.  0  ? dibucaine (NUPERCAINAL) 1 % OINT Place 1 application. rectally as needed for hemorrhoids.    ? docusate sodium (COLACE) 100 MG capsule Take 1 capsule (100 mg total) by mouth 2 (two) times daily. 10 capsule 0  ? ibuprofen (ADVIL) 600 MG tablet Take 1 tablet (600 mg total) by mouth every 6 (six) hours. 30  tablet 0  ? metroNIDAZOLE (METROGEL) 0.75 % vaginal gel Place 1 Applicatorful vaginally at bedtime. Apply one applicatorful to vagina at bedtime for 5 days 70 g 0  ? Prenatal Vit-Fe Fumarate-FA (PRENATAL MULTIVITAMIN) TABS tablet Take 1 tablet by mouth daily at 12 noon.    ? simethicone (MYLICON) 80 MG chewable tablet Chew 1 tablet (80 mg total) by mouth as needed for flatulence. 30 tablet 0  ? witch hazel-glycerin (TUCKS) pad Apply 1 application. topically as needed for hemorrhoids. 40 each 12  ? ?No current facility-administered medications for this visit.  ? ? ?Review of Systems  ?Constitutional: Negative for chills, fatigue, fever and unexpected weight change.  ?HENT: Negative for trouble swallowing.  ?Eyes: Negative for loss of vision.  ?Respiratory: Negative for cough, shortness of breath and wheezing.  ?Cardiovascular: Negative for chest pain, leg swelling, palpitations and syncope.  ?GI: Negative for abdominal pain, blood in stool, diarrhea, nausea and vomiting.  ?GU: Negative for difficulty urinating, dysuria, frequency and hematuria.  ?Musculoskeletal: Negative for back pain, leg pain and joint pain.  ?Skin: Negative for rash.  ?Neurological: Negative for dizziness, headaches, light-headedness, numbness and seizures.  ?Psychiatric: Negative for behavioral problem, confusion, depressed mood and sleep disturbance.   ? ?   ?Objective:  ?Objective  ? ?Vitals:  ? 03/18/22 0925  ?BP: 120/80  ?Weight: 167 lb (75.8 kg)  ?  Height: 5\' 3"  (1.6 m)  ? ?Body mass index is 29.58 kg/m?. ? ?Physical Exam ?Vitals and nursing note reviewed. Exam conducted with a chaperone present.  ?Constitutional:   ?   Appearance: Normal appearance. She is well-developed.  ?HENT:  ?   Head: Normocephalic and atraumatic.  ?Eyes:  ?   Extraocular Movements: Extraocular movements intact.  ?   Pupils: Pupils are equal, round, and reactive to light.  ?Cardiovascular:  ?   Rate and Rhythm: Normal rate and regular rhythm.  ?Pulmonary:  ?   Effort:  Pulmonary effort is normal. No respiratory distress.  ?   Breath sounds: Normal breath sounds.  ?Abdominal:  ?   General: Abdomen is flat.  ?   Palpations: Abdomen is soft.  ?Genitourinary: ? ? ?Musculoskeletal:     ?   General: No signs of injury.  ?Skin: ?   General: Skin is warm and dry.  ?Neurological:  ?   Mental Status: She is alert and oriented to person, place, and time.  ?Psychiatric:     ?   Behavior: Behavior normal.     ?   Thought Content: Thought content normal.     ?   Judgment: Judgment normal.  ? ? ?Assessment/Plan:  ?  ? 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 ?Continue MetroGel usage. ?Continue clobetasol nightly for approximately 1 week until resolution of symptoms. ?Follow up as needed ? ?More than 10 minutes were spent face to face with the patient in the room, reviewing the medical record, labs and images, and coordinating care for the patient. The plan of management was discussed in detail and counseling was provided.  ? ?  ?34 MD ?[facility], [facility] ?03/18/2022 ?9:29 AM ? ? ?

## 2022-03-19 ENCOUNTER — Encounter: Payer: Self-pay | Admitting: Obstetrics and Gynecology

## 2022-03-20 NOTE — Telephone Encounter (Signed)
Should she switch to the Metronidazole pill? Or is this normal?

## 2022-03-24 ENCOUNTER — Encounter: Payer: Self-pay | Admitting: Obstetrics

## 2022-03-24 ENCOUNTER — Ambulatory Visit (INDEPENDENT_AMBULATORY_CARE_PROVIDER_SITE_OTHER): Payer: BC Managed Care – PPO | Admitting: Obstetrics

## 2022-03-24 ENCOUNTER — Other Ambulatory Visit (HOSPITAL_COMMUNITY)
Admission: RE | Admit: 2022-03-24 | Discharge: 2022-03-24 | Disposition: A | Discharge status: Home / Self Care | Payer: BC Managed Care – PPO | Source: Ambulatory Visit | Attending: Obstetrics | Admitting: Obstetrics

## 2022-03-24 VITALS — BP 118/74 | Wt 167.0 lb

## 2022-03-24 DIAGNOSIS — Z124 Encounter for screening for malignant neoplasm of cervix: Secondary | ICD-10-CM | POA: Insufficient documentation

## 2022-03-24 DIAGNOSIS — Z8742 Personal history of other diseases of the female genital tract: Secondary | ICD-10-CM | POA: Insufficient documentation

## 2022-03-24 NOTE — Progress Notes (Signed)
?Postpartum Visit  ?Chief Complaint:  ?Chief Complaint  ?Patient presents with  ? Postpartum Care  ? ? ?History of Present Illness: Patient is a 26 y.o. G2P1001 presents for postpartum visit. ? ?Date of delivery: 02/10/2022 ?Type of delivery: Vaginal delivery - Vacuum or forceps assisted  yes ?Episiotomy No.  ?Laceration: yes 2nd degree, repaired by Dr. Tiburcio Pea ?Pregnancy or labor problems:  no ?Any problems since the delivery:  yes- seen for limited edema, and then for some BV and ext labial irritation. ? ?Newborn Details:  ?SINGLETON :  ?1. Baby's name: girl. Birth weight: 4150 gms ?Maternal Details:  ?Breast Feeding:  yes ?Post partum depression/anxiety noted:  no ?Edinburgh Post-Partum Depression Score:  8  ?Date of last PAP: 2022  abnormal per patient report ? ?Past Medical History:  ?Diagnosis Date  ? Amniotic fluid leaking 11/06/2021  ? Ectopic pregnancy 12/24/2020  ? History of ectopic pregnancy   ? Migraine   ? Vaginal discharge 11/06/2021  ? ? ?Past Surgical History:  ?Procedure Laterality Date  ? WISDOM TOOTH EXTRACTION    ? ? ?Prior to Admission medications   ?Medication Sig Start Date End Date Taking? Authorizing Provider  ?acetaminophen (TYLENOL) 325 MG tablet Take 2 tablets (650 mg total) by mouth every 4 (four) hours as needed (for pain scale < 4). 02/11/22   Dominic, Courtney Heys, CNM  ?benzocaine-Menthol (DERMOPLAST) 20-0.5 % AERO Apply 1 application. topically as needed for irritation (perineal discomfort). 02/11/22   Dominic, Courtney Heys, CNM  ?clobetasol ointment (TEMOVATE) 0.05 % Apply to affected area every night for 4 weeks 03/09/22   Natale Milch, MD  ?coconut oil OIL Apply 1 application. topically as needed. 02/11/22   Dominic, Courtney Heys, CNM  ?dibucaine (NUPERCAINAL) 1 % OINT Place 1 application. rectally as needed for hemorrhoids. 02/11/22   Dominic, Courtney Heys, CNM  ?docusate sodium (COLACE) 100 MG capsule Take 1 capsule (100 mg total) by mouth 2 (two) times daily. 02/11/22   Dominic, Courtney Heys, CNM  ?ibuprofen (ADVIL) 600 MG tablet Take 1 tablet (600 mg total) by mouth every 6 (six) hours. 02/11/22   Dominic, Courtney Heys, CNM  ?metroNIDAZOLE (METROGEL) 0.75 % vaginal gel Place 1 Applicatorful vaginally at bedtime. Apply one applicatorful to vagina at bedtime for 5 days 03/17/22   Natale Milch, MD  ?Prenatal Vit-Fe Fumarate-FA (PRENATAL MULTIVITAMIN) TABS tablet Take 1 tablet by mouth daily at 12 noon.    [provider]  ?simethicone (MYLICON) 80 MG chewable tablet Chew 1 tablet (80 mg total) by mouth as needed for flatulence. 02/11/22   Dominic, Courtney Heys, CNM  ?witch hazel-glycerin (TUCKS) pad Apply 1 application. topically as needed for hemorrhoids. 02/11/22   Dominic, Courtney Heys, CNM  ? ? ?No Known Allergies  ? ?Social History  ? ?Socioeconomic History  ? Marital status: Married  ?  Spouse name: Lorah Kesselman  ? Number of children: Not on file  ? Years of education: Not on file  ? Highest education level: Not on file  ?Occupational History  ? Not on file  ?Tobacco Use  ? Smoking status: Never  ? Smokeless tobacco: Never  ?Vaping Use  ? Vaping Use: Never used  ?Substance and Sexual Activity  ? Alcohol use: Not Currently  ?  Comment: socially  ? Drug use: Never  ? Sexual activity: Yes  ?  Birth control/protection: None  ?Other Topics Concern  ? Not on file  ?Social History Narrative  ? Not on file  ? ?Social  Determinants of Health  ? ?Financial Resource Strain: Not on file  ?Food Insecurity: Not on file  ?Transportation Needs: Not on file  ?Physical Activity: Not on file  ?Stress: Not on file  ?Social Connections: Not on file  ?Intimate Partner Violence: Not on file  ? ? ?Family History  ?Problem Relation Age of Onset  ? Healthy Mother   ? Healthy Father   ? ? ?ROS  ? ?Physical Exam ?BP 118/74   Wt 167 lb (75.8 kg)   Breastfeeding Yes   BMI 29.58 kg/m?   ?OBGyn Exam  ? ?Female Chaperone present during breast and/or pelvic exam. ? ?Assessment: 26 y.o. G2P1001 presenting for 6 week  postpartum visit ? ?Plan: ?Problem List Items Addressed This Visit   ?None ?Visit Diagnoses   ? ? Postpartum care and examination    -  Primary  ? ?  ? ? ? ?1) Contraception Education given regarding options for contraception, and she is opting for condoms only.. ? ?2)  Pap ?- ASCCP guidelines and rational discussed.  Patient opts for annual screening interval ? ?3) Patient underwent screening for postpartum depression with no concerns noted. ? ?4) Follow up 1 year for routine annual exam ?Pap smear retrieved today. ?Imagene Riches, CNM  ?03/24/2022 1:18 PM  ? ?03/24/2022 1:18 PM    ?

## 2022-03-25 ENCOUNTER — Encounter: Payer: Self-pay | Admitting: Obstetrics

## 2022-03-26 LAB — CYTOLOGY - PAP
Adequacy: ABSENT
Comment: NEGATIVE
Diagnosis: NEGATIVE
High risk HPV: NEGATIVE

## 2022-03-27 ENCOUNTER — Encounter: Payer: Self-pay | Admitting: Obstetrics

## 2022-04-02 ENCOUNTER — Encounter: Payer: Self-pay | Admitting: Obstetrics

## 2022-04-14 ENCOUNTER — Ambulatory Visit (INDEPENDENT_AMBULATORY_CARE_PROVIDER_SITE_OTHER): Payer: BC Managed Care – PPO | Admitting: Obstetrics

## 2022-04-14 ENCOUNTER — Encounter: Payer: Self-pay | Admitting: Obstetrics

## 2022-04-14 VITALS — BP 130/90 | Ht 63.0 in | Wt 171.0 lb

## 2022-04-14 DIAGNOSIS — N939 Abnormal uterine and vaginal bleeding, unspecified: Secondary | ICD-10-CM

## 2022-04-14 LAB — POCT URINE PREGNANCY: Preg Test, Ur: NEGATIVE

## 2022-04-14 NOTE — Patient Instructions (Signed)
Have a great year! Please call with any concerns. ?Don't forget to wear your seatbelt everyday! ?If you are not signed up on [facility] MyChart, please ask us how to sign up for it!  ? ?In a world where you can be anything, please be kind.  ? ?

## 2022-04-14 NOTE — Progress Notes (Signed)
Chief Complaint  ?Patient presents with  ? Vaginal Bleeding  ?  C/O Dark red bleeding, had bleeding for 2 weeks after giving birth, started back at 6 weeks and now at 9 weeks. Reports some pelvic pain. States this is not like her normal period,heavier period then normal.  ? ?Patient Kathryn Wong [id number] Patient's last menstrual period was 04/13/2022.  Currently condoms for contraception who presents for the complaint of abnormal uterine bleeding. She had a normal episode of bleeding postpartum like a period but then again had a heavy bleed that was dark. She is a avid exerciser at the gym and recently started boot camp. Following exercise, bleeding worsened. S/p vaginal Delivery march 7. Patient Is breast feeding with no significant change in milk supply. First period heavy was 4/15 and then again 5/9. Previously periods were very light and clockwork and she only had to wear liner. She had been on OCPs for 7 years. When she stopped OCPs she conceived within 2 months with ectopic pregnancy 1/22, tx with methotraxate. She had only once cycle followed by [facility] then conceived in June.  ?Recent glucocorticoid use: no ? ?She denies vision changes, bleeding gums, or nose bleeds. She denies easy bleeding or bruising. There is no family history of blood disorders. She is sexually active with no problems. She is exercising regularly. She does perform self breast exam. She takes multivitamin. She denies vasomotor symptoms, or vaginal dryness. She reports that she did develop what sounds like groin cellulitis after groin rash due to continued use of protective garments.  Records state lichen simplex chronicus. Symptoms resolved with clobetaol, diflucan and metrogel.  ?Ultrasound: ?None ? ?Follows with primary care provider: Malachy Mood, MD ? ?Review of Systems  ?Constitutional: Negative.  Negative for activity change, appetite change, chills, diaphoresis, fatigue, fever and unexpected weight change.  ?HENT: Negative.   Negative for congestion, dental problem, drooling, ear discharge, ear pain, facial swelling, hearing loss, mouth sores, nosebleeds, postnasal drip, rhinorrhea, sinus pressure, sinus pain, sneezing, sore throat, tinnitus, trouble swallowing and voice change.   ?Eyes: Negative.  Negative for photophobia, pain, discharge, redness, itching and visual disturbance.  ?Respiratory: Negative.  Negative for apnea, cough, choking, chest tightness, shortness of breath, wheezing and stridor.   ?Cardiovascular: Negative.  Negative for chest pain, palpitations and leg swelling.  ?Gastrointestinal: Negative.  Negative for abdominal distention, abdominal pain, anal bleeding, blood in stool, constipation, diarrhea, nausea, rectal pain and vomiting.  ?Endocrine: Negative.  Negative for cold intolerance, heat intolerance, polydipsia, polyphagia and polyuria.  ?Genitourinary:  Positive for vaginal bleeding. Negative for decreased urine volume, difficulty urinating, dyspareunia, dysuria, enuresis, flank pain, frequency, genital sores, hematuria, menstrual problem, pelvic pain, urgency, vaginal discharge and vaginal pain.  ?Musculoskeletal:  Negative for arthralgias, back pain, gait problem, joint swelling, myalgias, neck pain and neck stiffness.  ?Skin:  Negative for color change, pallor, rash and wound.  ?Allergic/Immunologic: Negative.   ?Neurological: Negative.  Negative for dizziness, tremors, seizures, syncope, facial asymmetry, speech difficulty, weakness, light-headedness, numbness and headaches.  ?Hematological: Negative.  Negative for adenopathy. Does not bruise/bleed easily.  ?Psychiatric/Behavioral: Negative.  Negative for behavioral problems, confusion, decreased concentration, dysphoric mood, hallucinations, self-injury, sleep disturbance and suicidal ideas. The patient is not nervous/anxious and is not hyperactive.   ?All other systems reviewed and are negative. ? ?Gynecologic History: ?Period Cycle (Days): 28 ?Period  Duration (Days): 4 ?Period Pattern: Regular ?Menstrual Flow: Light ?Dysmenorrhea: None ? ?She denies abnormal paps. She denies pelvic infections. She denies domestic violence or  sexual abuse. ? ?Health Maintenance  ?Topic Date Due  ? COVID-19 Vaccine (1) Never done  ? Hepatitis C Screening  Never done  ? INFLUENZA VACCINE  07/07/2022  ? PAP-Cervical Cytology Screening  03/24/2025  ? PAP SMEAR-Modifier  03/24/2025  ? TETANUS/TDAP  11/26/2031  ? HIV Screening  Completed  ? HPV VACCINES  Aged Out  ? ? ? ?Past Medical History:  ?Diagnosis Date  ? Amniotic fluid leaking 11/06/2021  ? Ectopic pregnancy 12/24/2020  ? History of ectopic pregnancy   ? Migraine   ? Vaginal discharge 11/06/2021  ? ?Past Surgical History:  ?Procedure Laterality Date  ? WISDOM TOOTH EXTRACTION    ? ?Family History  ?Problem Relation Age of Onset  ? Healthy Mother   ? Healthy Father   ? ?Social History  ? ?Socioeconomic History  ? Marital status: Married  ?  Spouse name: Schyler Shell  ? Number of children: Not on file  ? Years of education: Not on file  ? Highest education level: Not on file  ?Occupational History  ? Not on file  ?Tobacco Use  ? Smoking status: Never  ? Smokeless tobacco: Never  ?Vaping Use  ? Vaping Use: Never used  ?Substance and Sexual Activity  ? Alcohol use: Not Currently  ?  Comment: socially  ? Drug use: Never  ? Sexual activity: Yes  ?  Birth control/protection: None  ?Other Topics Concern  ? Not on file  ?Social History Narrative  ? Not on file  ? ?Social Determinants of Health  ? ?Financial Resource Strain: Not on file  ?Food Insecurity: Not on file  ?Transportation Needs: Not on file  ?Physical Activity: Not on file  ?Stress: Not on file  ?Social Connections: Not on file  ?Intimate Partner Violence: Not on file  ? ?Medicine list and allergies reviewed and updated.   ? ?Objective:  ?BP 130/90   Ht 5\' 3"  (1.6 m)   Wt 171 lb (77.6 kg)   LMP 04/13/2022   Breastfeeding Yes   BMI 30.29 kg/m?  ?Physical Exam ?Vitals and  nursing note reviewed. Exam conducted with a chaperone present.  ?Constitutional:   ?   Appearance: Normal appearance.  ?HENT:  ?   Head: Normocephalic and atraumatic.  ?Cardiovascular:  ?   Rate and Rhythm: Normal rate and regular rhythm.  ?   Heart sounds: Normal heart sounds.  ?Pulmonary:  ?   Effort: Pulmonary effort is normal.  ?   Breath sounds: Normal breath sounds.  ?Abdominal:  ?   General: There is no distension.  ?   Palpations: There is no mass.  ?   Tenderness: There is no abdominal tenderness. There is no guarding.  ?   Hernia: No hernia is present. There is no hernia in the left inguinal area or right inguinal area.  ?Genitourinary: ?   General: Normal vulva.  ?   Exam position: Lithotomy position.  ?   Pubic Area: No rash.   ?   Labia:     ?   Right: No lesion.     ?   Left: No lesion.   ?   Vagina: Vaginal discharge (mentrual flow) present. No bleeding or lesions.  ?   Cervix: Cervical bleeding (mentsrual bleeding) present. No discharge or lesion.  ?   Uterus: Normal. Not enlarged, not tender and no uterine prolapse.   ?   Adnexa: Right adnexa normal.    ?   Right: No tenderness or fullness.      ?  Left: No tenderness or fullness.    ?Musculoskeletal:     ?   General: Normal range of motion.  ?   Cervical back: Normal range of motion.  ?Lymphadenopathy:  ?   Lower Body: No right inguinal adenopathy. No left inguinal adenopathy.  ?Skin: ?   General: Skin is warm and dry.  ?Neurological:  ?   General: No focal deficit present.  ?   Mental Status: She is oriented to person, place, and time.  ?   Coordination: Coordination normal.  ?   Deep Tendon Reflexes: Reflexes normal.  ?Psychiatric:     ?   Mood and Affect: Mood normal.     ?   Behavior: Behavior normal.     ?   Thought Content: Thought content normal.  ? ?Assessment:  ?Abnormal uterine bleeding (AUB) - Plan: POCT urine pregnancy ? ?Breast feeding status of mother ? ?Plan: ?We discussed that her bleeding pattern appears to still be within the  realm of normal as she is not yet three months postpartum. UPT today was negative. She has a history of AUB after stopping OCPs and this abnormal pattern may benefit from hormonal contraception to normalize. We will p

## 2022-04-15 ENCOUNTER — Encounter: Payer: Self-pay | Admitting: Obstetrics

## 2022-04-24 ENCOUNTER — Encounter: Payer: Self-pay | Admitting: Obstetrics

## 2022-04-28 ENCOUNTER — Other Ambulatory Visit: Payer: Self-pay | Admitting: Obstetrics

## 2022-04-28 ENCOUNTER — Encounter: Payer: Self-pay | Admitting: Obstetrics

## 2022-04-28 DIAGNOSIS — N939 Abnormal uterine and vaginal bleeding, unspecified: Secondary | ICD-10-CM

## 2022-04-29 ENCOUNTER — Other Ambulatory Visit: Payer: Self-pay | Admitting: Advanced Practice Midwife

## 2022-04-29 DIAGNOSIS — L280 Lichen simplex chronicus: Secondary | ICD-10-CM

## 2022-04-29 MED ORDER — CLOBETASOL PROPIONATE 0.05 % EX OINT
TOPICAL_OINTMENT | CUTANEOUS | 1 refills | Status: AC
Start: 2022-04-29 — End: ?

## 2022-04-29 NOTE — Progress Notes (Signed)
Rx clobetasol sent per patient request.

## 2022-04-30 ENCOUNTER — Ambulatory Visit (INDEPENDENT_AMBULATORY_CARE_PROVIDER_SITE_OTHER): Payer: BC Managed Care – PPO

## 2022-04-30 ENCOUNTER — Ambulatory Visit (INDEPENDENT_AMBULATORY_CARE_PROVIDER_SITE_OTHER): Payer: BC Managed Care – PPO | Admitting: Obstetrics

## 2022-04-30 ENCOUNTER — Encounter: Payer: Self-pay | Admitting: Obstetrics

## 2022-04-30 ENCOUNTER — Other Ambulatory Visit: Payer: Self-pay | Admitting: Obstetrics

## 2022-04-30 VITALS — BP 120/70 | Ht 63.0 in | Wt 173.0 lb

## 2022-04-30 DIAGNOSIS — F419 Anxiety disorder, unspecified: Secondary | ICD-10-CM | POA: Diagnosis not present

## 2022-04-30 DIAGNOSIS — L280 Lichen simplex chronicus: Secondary | ICD-10-CM | POA: Diagnosis not present

## 2022-04-30 DIAGNOSIS — N939 Abnormal uterine and vaginal bleeding, unspecified: Secondary | ICD-10-CM

## 2022-04-30 DIAGNOSIS — N9089 Other specified noninflammatory disorders of vulva and perineum: Secondary | ICD-10-CM | POA: Diagnosis not present

## 2022-04-30 MED ORDER — FLUCONAZOLE 150 MG PO TABS: 150.0000 mg | ORAL_TABLET | Freq: Once | ORAL | 0 refills | Status: AC

## 2022-04-30 MED ORDER — BUSPIRONE HCL 7.5 MG PO TABS: 7.5000 mg | ORAL_TABLET | Freq: Two times a day (BID) | ORAL | 0 refills | Status: DC

## 2022-04-30 MED ORDER — CLOTRIMAZOLE 1 % VA CREA: 1.0000 | TOPICAL_CREAM | Freq: Every day | VAGINAL | 0 refills | Status: AC

## 2022-04-30 NOTE — Progress Notes (Signed)
Chief Complaint  Patient presents with   Postpartum Care   Patient Kathryn Wong is an 26 y.o. year old G2P1011 Patient's last menstrual period was 04/13/2022. currently condoms for contraception who presents for ultrasound and follow up. She is also here because she has been having worsening anxiety, fearful something may happen to baby. Family members have not been respectful of boundaries and the baby had a choking incident as well. She is worried what could happen if she is not around. The rash dx as lichen simplex chronicus has also come back. It is not as bad as it was. She states last time it completely cleared up with clobetasol. Abnormal Bleeding resolved.   ULTRASOUND REPORT   Location: [facility]  Date of Service: 04/30/2022    Indications:Abnormal Uterine Bleeding; 11 weeks Post Partum Findings:  The uterus is retroverted and measures 7.7 x 5.4 x 3.4 cm. Echo texture is heterogenous without evidence of focal masses.   The Endometrium measures 4.3 mm; there is a small amount of blood in the endometrium. In the Lower Uterine Segment of the endometrial canal, there is increased blood supply seen on color doppler imaging; there is a question of a small polyp or very small area of RPOC. While there is relatively low suspicion for RPOC, neither can be definitively ruled out on this scan.    Right Ovary measures 3.0 x 2.2 x 2.3 cm. It is normal in appearance. Left Ovary measures 3.3 x 2.7 x 2.2 cm; there is a 2 cm dominant follicle noted. Survey of the adnexa demonstrates no adnexal masses. There is no free fluid in the cul de sac.   Impression: 1. Possible endometrial polyp. 2. Possible RPOC, although less likely.    Recommendations: 1.Clinical correlation with the patient's History and Physical Exam. 2. Short term follow up with ultrasound or other imaging modality.   Lise Auer  Past Medical History:  Diagnosis Date   Amniotic fluid leaking  11/06/2021   Ectopic pregnancy 12/24/2020   History of ectopic pregnancy    Migraine    Vaginal discharge 11/06/2021   Past Surgical History:  Procedure Laterality Date   WISDOM TOOTH EXTRACTION     Family History  Problem Relation Age of Onset   Healthy Mother    Healthy Father    Social History   Socioeconomic History   Marital status: Married    Spouse name: Engineer, civil (consulting)   Number of children: Not on file   Years of education: Not on file   Highest education level: Not on file  Occupational History   Not on file  Tobacco Use   Smoking status: Never   Smokeless tobacco: Never  Vaping Use   Vaping Use: Never used  Substance and Sexual Activity   Alcohol use: Not Currently    Comment: socially   Drug use: Never   Sexual activity: Yes    Birth control/protection: None  Other Topics Concern   Not on file  Social History Narrative   Not on file   Social Determinants of Health   Financial Resource Strain: Not on file  Food Insecurity: Not on file  Transportation Needs: Not on file  Physical Activity: Not on file  Stress: Not on file  Social Connections: Not on file  Intimate Partner Violence: Not on file    Medicine list and allergies reviewed and updated.    BP 120/70   Ht 5\' 3"  (1.6 m)   Wt 173 lb (78.5 kg)  LMP 04/13/2022   Breastfeeding Yes   BMI 30.65 kg/m     04/30/2022    2:10 PM  GAD 7 : Generalized Anxiety Score  Nervous, Anxious, on Edge 2  Control/stop worrying 2  Worry too much - different things 2  Trouble relaxing 1  Restless 0  Easily annoyed or irritable 0  Afraid - awful might happen 2  Total GAD 7 Score 9  Anxiety Difficulty Very difficult      04/30/2022    2:10 PM  PHQ9 SCORE ONLY  PHQ-9 Total Score 1    Physical Exam Vitals and nursing note reviewed. Exam conducted with a chaperone present.  Constitutional:      General: She is not in acute distress.    Appearance: Normal appearance. She is not ill-appearing.  HENT:      Head: Normocephalic and atraumatic.     Mouth/Throat:     Mouth: Mucous membranes are moist.     Pharynx: No oropharyngeal exudate.  Eyes:     Extraocular Movements: Extraocular movements intact.  Neck:     Thyroid: No thyromegaly.  Cardiovascular:     Rate and Rhythm: Normal rate.  Pulmonary:     Effort: Pulmonary effort is normal. No respiratory distress.  Chest:     Chest wall: No mass or tenderness.  Breasts:    Breasts are symmetrical.     Right: Normal. No mass, nipple discharge, skin change or tenderness.     Left: Normal. No mass, nipple discharge, skin change or tenderness.  Abdominal:     General: There is no distension.  Genitourinary:    General: Normal vulva.     Exam position: Lithotomy position.     Pubic Area: No rash.      Labia:        Right: No rash, tenderness or lesion.        Left: No rash, tenderness or lesion.      Urethra: No urethral lesion.     Vagina: Normal. No foreign body. No vaginal discharge, tenderness or lesions.     Cervix: No discharge, friability, lesion, erythema or cervical bleeding.     Uterus: Normal. Not enlarged, not tender and no uterine prolapse.      Adnexa: Right adnexa normal and left adnexa normal.       Right: No tenderness or fullness.         Left: No tenderness or fullness.      Musculoskeletal:        General: No tenderness. Normal range of motion.     Cervical back: Normal range of motion. No tenderness.     Right lower leg: No edema.     Left lower leg: No edema.  Lymphadenopathy:     Cervical: No cervical adenopathy.     Upper Body:     Right upper body: No axillary adenopathy.     Left upper body: No axillary adenopathy.     Lower Body: No right inguinal adenopathy. No left inguinal adenopathy.  Skin:    General: Skin is warm and dry.  Neurological:     General: No focal deficit present.     Mental Status: She is alert and oriented to person, place, and time. Mental status is at baseline.      Coordination: Coordination normal.     Gait: Gait normal.     Deep Tendon Reflexes: Reflexes normal.  Psychiatric:        Mood and Affect: Mood normal.  Behavior: Behavior normal.        Thought Content: Thought content normal.        Judgment: Judgment normal.    Anxiety - Plan: DISCONTINUED: busPIRone (BUSPAR) 7.5 MG tablet  Lesion of vulva - Plan: clotrimazole (GYNE-LOTRIMIN) 1 % vaginal cream, fluconazole (DIFLUCAN) 150 MG tablet  Lichen simplex chronicus  Initially discussed rx Buspar for anxiety but newer information suggests it not safe while breastfeeding although it is class B in pregnancy. We discussed used of vistaril but this is sedating. Recommend patient establish care with primary care to discuss additional options. She will talk with her therapist about coping strategies. We also discussed this to be within normal for new parents and to consider support groups.  US reviewed with area of vascularity but very thin endo stripe. Possible small polyp. Will plan follow up US in 3 mo to check stability as bleeding now stable.  Will rx lotrimin and diflucan for symptoms, and patient has refill on clobetasol. If area recurs would recommend biopsy.   A total of 47 minutes were spent for the patient inclusive of face-to-face time during visit, preparation, review, communication, and documentation during this encounter.   Return in about 3 months (around 07/31/2022) for rpt GYN US and follow up .

## 2022-04-30 NOTE — Patient Instructions (Signed)
Have a great year! Please call with any concerns. Don't forget to wear your seatbelt everyday! If you are not signed up on MyChart, please ask [place] how to sign up for it!   In a world where you can be anything, please be kind.   Body mass index is 30.65 kg/m.  A Healthy Lifestyle: Care Instructions Your Care Instructions  A healthy lifestyle can help you feel good, stay at a healthy weight, and have plenty of energy for both work and play. A healthy lifestyle is something you can share with your whole family. A healthy lifestyle also can lower your risk for serious health problems, such as high blood pressure, heart disease, and diabetes. You can follow a few steps listed below to improve your health and the health of your family. Follow-up care is a key part of your treatment and safety. Be sure to make and go to all appointments, and call your doctor if you are having problems. It's also a good idea to know your test results and keep a list of the medicines you take. How can you care for yourself at home? Do not eat too much sugar, fat, or fast foods. You can still have dessert and treats now and then. The goal is moderation. Start small to improve your eating habits. Pay attention to portion sizes, drink less juice and soda pop, and eat more fruits and vegetables. Eat a healthy amount of food. A 3-ounce serving of meat, for example, is about the size of a deck of cards. Fill the rest of your plate with vegetables and whole grains. Limit the amount of soda and sports drinks you have every day. Drink more water when you are thirsty. Eat at least 5 servings of fruits and vegetables every day. It may seem like a lot, but it is not hard to reach this goal. A serving or helping is 1 piece of fruit, 1 cup of vegetables, or 2 cups of leafy, raw vegetables. Have an apple or some carrot sticks as an afternoon snack instead of a candy bar. Try to have fruits and/or vegetables at every meal. Make exercise  part of your daily routine. You may want to start with simple activities, such as walking, bicycling, or slow swimming. Try to be active 30 to 60 minutes every day. You do not need to do all 30 to 60 minutes all at once. For example, you can exercise 3 times a day for 10 or 20 minutes. Moderate exercise is safe for most people, but it is always a good idea to talk to your doctor before starting an exercise program. Keep moving. Mow the lawn, work in the garden, or [employer]. Take the stairs instead of the elevator at work. If you smoke, quit. People who smoke have an increased risk for heart attack, stroke, cancer, and other lung illnesses. Quitting is hard, but there are ways to boost your chance of quitting tobacco for good. Use nicotine gum, patches, or lozenges. Ask your doctor about stop-smoking programs and medicines. Keep trying. In addition to reducing your risk of diseases in the future, you will notice some benefits soon after you stop using tobacco. If you have shortness of breath or asthma symptoms, they will likely get better within a few weeks after you quit. Limit how much alcohol you drink. Moderate amounts of alcohol (up to 2 drinks a day for men, 1 drink a day for women) are okay. But drinking too much can lead to  liver problems, high blood pressure, and other health problems. Family health If you have a family, there are many things you can do together to improve your health. Eat meals together as a family as often as possible. Eat healthy foods. This includes fruits, vegetables, lean meats and dairy, and whole grains. Include your family in your fitness plan. Most people think of activities such as jogging or tennis as the way to fitness, but there are many ways you and your family can be more active. Anything that makes you breathe hard and gets your heart pumping is exercise. Here are some tips: Walk to do errands or to take your child to school or the bus. Go for a family  bike ride after dinner instead of watching TV. Care instructions adapted under license by your healthcare professional. This care instruction is for use with your licensed healthcare professional. If you have questions about a medical condition or this instruction, always ask your healthcare professional. [place] any warranty or liability for your use of this information.

## 2022-06-28 IMAGING — RF DG HYSTEROGRAM
5 series · 5 of 5 positions shown · non-contrast
Comparison: None.

FLUOROSCOPY TIME:  0 minutes 42 seconds; 22.60 mGy; 4 acquired
images

CLINICAL DATA: History of previous ectopic gestation

EXAM:
HYSTEROSALPINGOGRAM
TECHNIQUE: Hysterosalpingogram was performed by the ordering physician under
fluoroscopy. Fluoroscopy provided by radiologist.

[Series 1: fluoro_hsg_singleshot_bw · 0.17mm/px · 1 of 1 slices shown (1 of 5)]
[im 1/1]
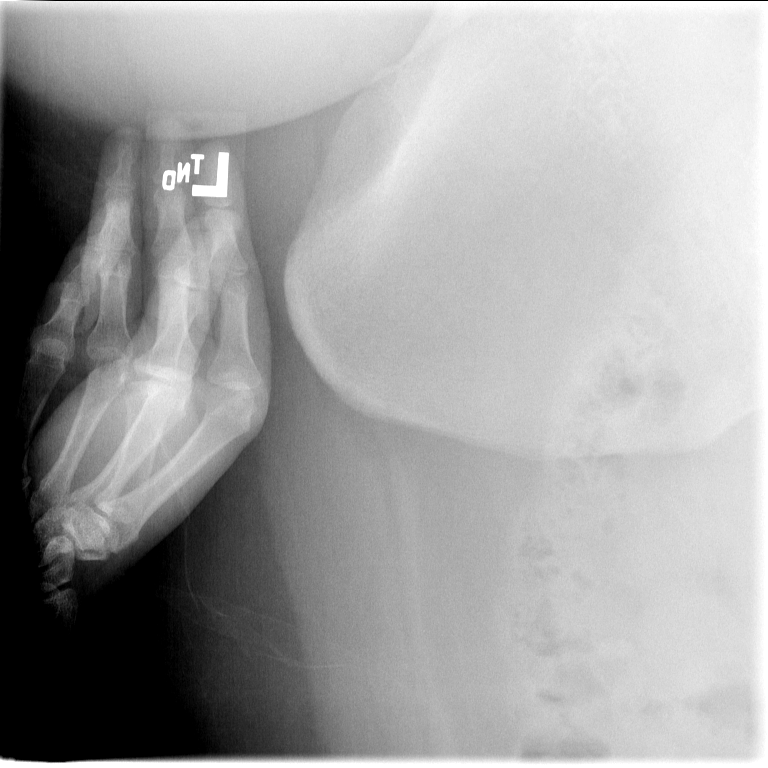

[Series 2: fluoro_hsg_singleshot_bw · 0.18mm/px · 1 of 1 slices shown (2 of 5)]
[im 1/1]
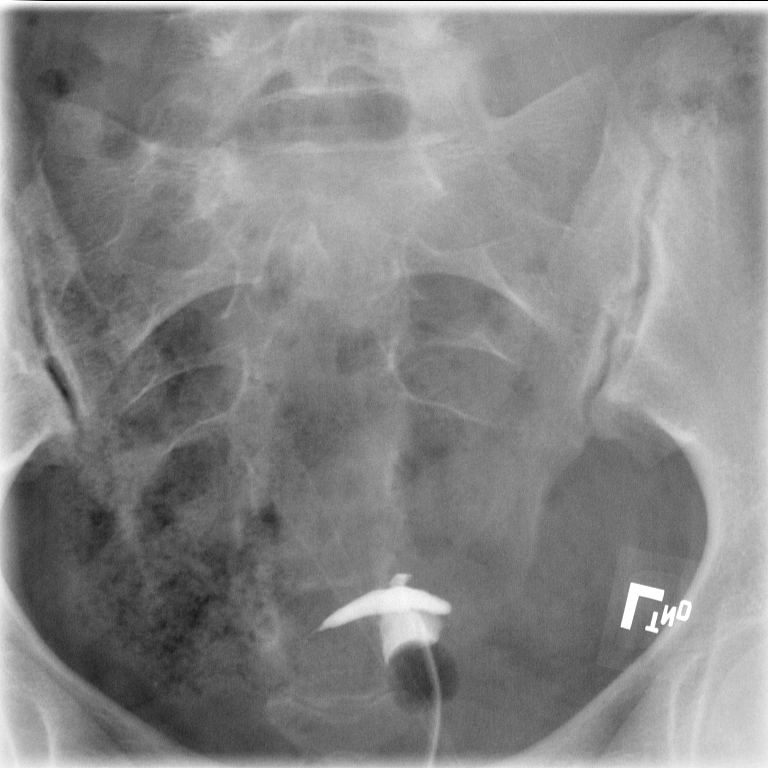

[Series 3: fluoro_hsg_singleshot_bw · 0.18mm/px · 1 of 1 slices shown (3 of 5)]
[im 1/1]
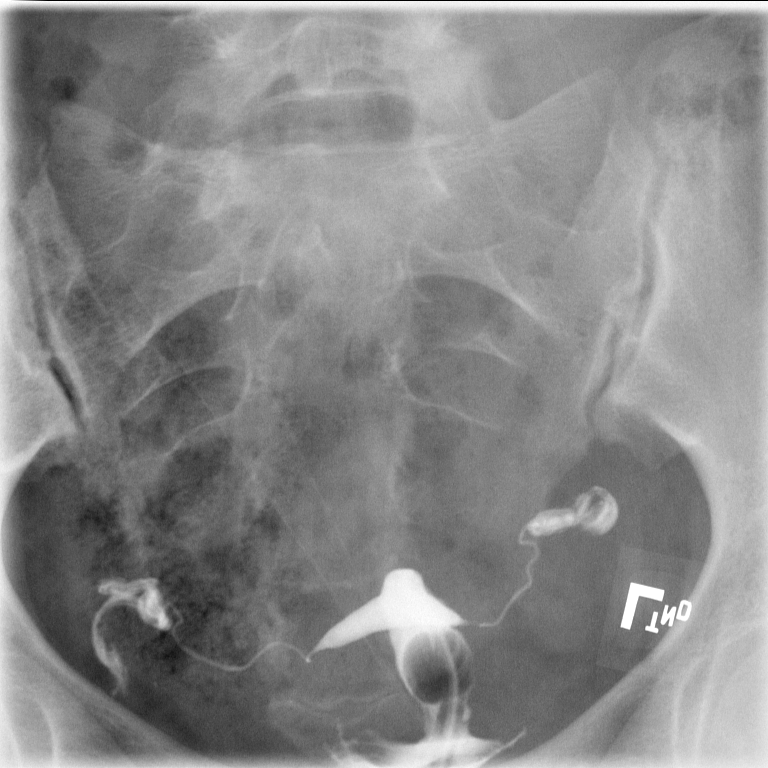

[Series 4: fluoro_hsg_singleshot_bw · 0.18mm/px · 1 of 1 slices shown (4 of 5)]
[im 1/1]
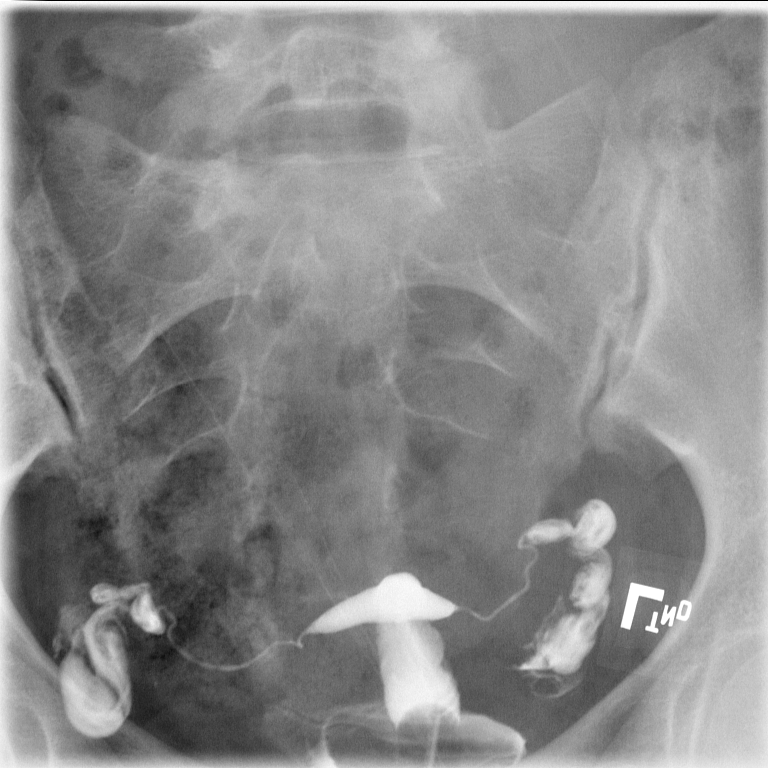

[Series 5: fluoro_hsg_singleshot_bw · 0.18mm/px · 1 of 1 slices shown (5 of 5)]
[im 1/1]
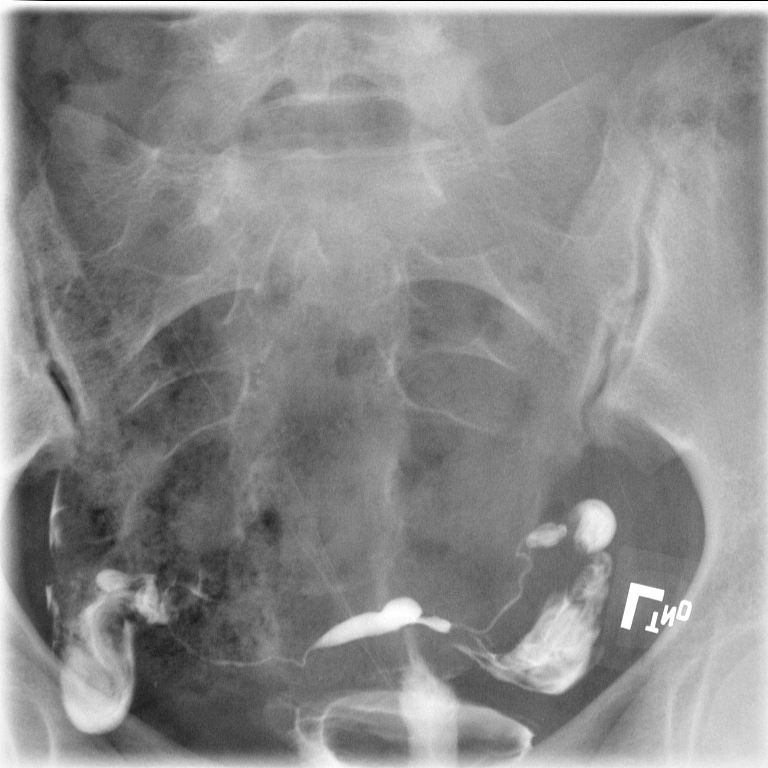

[5 of 5 positions shown; findings below may reference images not displayed]

FINDINGS: The patient's cervical os was cannulated by Dr. Tasif, OBGYN, and
contrast was administered. Uterus normal in size and contour. No
intrauterine mass. Both fallopian tubes are normal in size and
contour. There is free spill of contrast on each side.
IMPRESSION: Study within normal limits.

## 2023-02-10 ENCOUNTER — Other Ambulatory Visit: Payer: Self-pay

## 2023-02-10 NOTE — Progress Notes (Signed)
Pt completed pre-employment.

## 2024-05-14 ENCOUNTER — Telehealth: Payer: Self-pay | Admitting: Lactation Services

## 2024-05-14 NOTE — Telephone Encounter (Signed)
 Lactation received an outpatient referral to reach out to Kathryn Wong .  Patient stated that she has been having reoccurring clogged ducts in her left breast and she doesn't know why because this is the breast that produces less.   Mom expressed that she is 7 months postpartum and primarily pumps for her son.  Over the last month she started decreasing her pump session because she was making so much milk.  She also was having a hard time managing an infant and toddler.  She use to pump 9x a day and produce up to 50oz per day.  Now she is pumping 5x a day and producing around 34oz a day.  She stated that infant is eating solids and takes in about 5oz a milk a day.  The pump she is using a Spectra  pump.  When she first noticed the clogged she would pump, and if that didn't work then she would use the hand pump and if that wouldn't work she would hand express.  This past Friday, another one popped up but this time, she started feeling nauseous and this made her call her doctor who prescribed her antibiotics, which she is still taking.   Mom did stated that she has noticed the reoccurring as infant has started taking solids as well as her decreasing her pumping session.    LC provided some simple times to assist with the pain in the clogged ducts; ice, stay on pump schedule, and sunflower lecithin.  Mom stated she just brought sunflower lecithin and has started taking that.  Mom is concerned the supply is decreasing and she is not ready for that.  LC encouraged her to put in another pump session maybe around the 7am time frame to see if it helps increase or maintain her supply.    Moms goal is to stop pumping in August and feed her infant off the stash she has in freezer until he reaches a year.   Encouraged mom to call back lactation if she needs assistance or has more questions. Mom verbalized thanks!
